# Patient Record
Sex: Male | Born: 1980 | Race: White | Hispanic: No | Marital: Single | Smoking: Never smoker
Health system: Southern US, Community
[De-identification: ages and names within clinical notes are randomized; demographics above are authoritative.]

## PROBLEM LIST (undated history)

## (undated) DIAGNOSIS — I10 Essential (primary) hypertension: Secondary | ICD-10-CM

## (undated) DIAGNOSIS — J45909 Unspecified asthma, uncomplicated: Secondary | ICD-10-CM

## (undated) DIAGNOSIS — T7840XA Allergy, unspecified, initial encounter: Secondary | ICD-10-CM

## (undated) HISTORY — DX: Allergy, unspecified, initial encounter: T78.40XA

## (undated) HISTORY — DX: Unspecified asthma, uncomplicated: J45.909

## (undated) HISTORY — DX: Essential (primary) hypertension: I10

---
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
## 2004-04-23 ENCOUNTER — Emergency Department (HOSPITAL_COMMUNITY): Admission: EM | Admit: 2004-04-23 | Discharge: 2004-04-23 | Payer: Self-pay | Admitting: Family Medicine

## 2005-03-29 ENCOUNTER — Emergency Department (HOSPITAL_COMMUNITY): Admission: EM | Admit: 2005-03-29 | Discharge: 2005-03-29 | Payer: Self-pay | Admitting: Family Medicine

## 2005-04-14 ENCOUNTER — Emergency Department (HOSPITAL_COMMUNITY): Admission: EM | Admit: 2005-04-14 | Discharge: 2005-04-14 | Payer: Self-pay | Admitting: Family Medicine

## 2005-04-19 ENCOUNTER — Emergency Department (HOSPITAL_COMMUNITY): Admission: EM | Admit: 2005-04-19 | Discharge: 2005-04-19 | Payer: Self-pay | Admitting: Family Medicine

## 2006-04-17 ENCOUNTER — Emergency Department (HOSPITAL_COMMUNITY): Admission: EM | Admit: 2006-04-17 | Discharge: 2006-04-17 | Payer: Self-pay | Admitting: Family Medicine

## 2007-04-19 ENCOUNTER — Ambulatory Visit: Payer: Self-pay | Admitting: Internal Medicine

## 2007-04-19 ENCOUNTER — Inpatient Hospital Stay (HOSPITAL_COMMUNITY): Admission: EM | Admit: 2007-04-19 | Discharge: 2007-04-20 | Payer: Self-pay | Admitting: Emergency Medicine

## 2007-04-19 DIAGNOSIS — J982 Interstitial emphysema: Secondary | ICD-10-CM | POA: Insufficient documentation

## 2007-04-20 ENCOUNTER — Ambulatory Visit: Payer: Self-pay | Admitting: Cardiothoracic Surgery

## 2007-04-24 DIAGNOSIS — J42 Unspecified chronic bronchitis: Secondary | ICD-10-CM

## 2007-04-29 ENCOUNTER — Encounter (INDEPENDENT_AMBULATORY_CARE_PROVIDER_SITE_OTHER): Payer: Self-pay | Admitting: *Deleted

## 2007-04-29 ENCOUNTER — Ambulatory Visit: Payer: Self-pay | Admitting: Internal Medicine

## 2007-04-29 DIAGNOSIS — J301 Allergic rhinitis due to pollen: Secondary | ICD-10-CM | POA: Insufficient documentation

## 2007-04-29 LAB — CONVERTED CEMR LAB
Calcium: 9.5 mg/dL (ref 8.4–10.5)
Creatinine, Ser: 1.07 mg/dL (ref 0.40–1.50)
Glucose, Bld: 98 mg/dL (ref 70–99)
Potassium: 3.8 meq/L (ref 3.5–5.3)
Sodium: 141 meq/L (ref 135–145)

## 2008-04-16 ENCOUNTER — Emergency Department (HOSPITAL_COMMUNITY): Admission: EM | Admit: 2008-04-16 | Discharge: 2008-04-16 | Payer: Self-pay | Admitting: Emergency Medicine

## 2008-06-09 ENCOUNTER — Emergency Department (HOSPITAL_COMMUNITY): Admission: EM | Admit: 2008-06-09 | Discharge: 2008-06-09 | Payer: Self-pay | Admitting: Family Medicine

## 2009-04-08 ENCOUNTER — Emergency Department (HOSPITAL_COMMUNITY): Admission: EM | Admit: 2009-04-08 | Discharge: 2009-04-08 | Payer: Self-pay | Admitting: Emergency Medicine

## 2010-01-14 ENCOUNTER — Emergency Department (HOSPITAL_COMMUNITY): Admission: EM | Admit: 2010-01-14 | Discharge: 2010-01-14 | Payer: Self-pay | Admitting: Emergency Medicine

## 2011-03-27 ENCOUNTER — Inpatient Hospital Stay (INDEPENDENT_AMBULATORY_CARE_PROVIDER_SITE_OTHER)
Admission: RE | Admit: 2011-03-27 | Discharge: 2011-03-27 | Disposition: A | Discharge status: Home / Self Care | Payer: Self-pay | Source: Ambulatory Visit | Attending: Family Medicine | Admitting: Family Medicine

## 2011-03-27 DIAGNOSIS — H6120 Impacted cerumen, unspecified ear: Secondary | ICD-10-CM

## 2011-03-27 DIAGNOSIS — J069 Acute upper respiratory infection, unspecified: Secondary | ICD-10-CM

## 2011-03-27 DIAGNOSIS — J309 Allergic rhinitis, unspecified: Secondary | ICD-10-CM

## 2011-04-11 NOTE — Discharge Summary (Signed)
NAMEDUEY, LILLER NO.:  [id number]   MEDICAL RECORD NO.:  [id number]          PATIENT TYPE:  INP   LOCATION:  2925                         FACILITY:  [facility]   PHYSICIAN:  Duncan Dull, M.D.     DATE OF BIRTH:  1981-03-22   DATE OF ADMISSION:  04/19/2007  DATE OF DISCHARGE:  04/20/2007                               DISCHARGE SUMMARY   CONSULTANTS:  Kerin Perna, M.D.   DISCHARGE DIAGNOSIS:  1. Pneumomediastinum, likely secondary to cough effort.  2. History of allergies plus minor bronchitis episodes.   LIST OF MEDICATIONS AT DISCHARGE:  1. Avelox 40 mg p.o. daily for 3 more days.  2. Albuterol MDI inhaler 1 to 2 puffs inhaled every 4 hours p.r.n.      shortness of breath.  3. Afrin nasal spray 0.05% apply in each nostril x2 b.i.d.; do not use      for more than 3 days.  4. Tussionex 5 mL syrup p.o. b.i.d.  5. Mucinex 600 mg p.o. b.i.d.   The patient will have an appointment in the outpatient clinic, and the  clinic will call him with the appointment date and time within 2 weeks  of discharge (the appointment was scheduled through [place] alert).   CONDITION AT DISCHARGE:  Improved.   PROCEDURES:  The patient had a chest x-ray on arrival that showed  minimal pneumomediastinum along the left heart border.  Lung fields  clear, no pneumothorax.  He also had a CT angio of his chest that was negative for acute PE.  It  showed pneumomediastinum, nonspecific, and radiology commented that this  can be seen in asthma, barotrauma, esophageal perforation, and trauma.  Also, patchy ground-glass opacities in the right middle lobe and  lingula, nonspecific.  The patient also had Gastrografin esophagogram that was negative for  leak or strictures.  The study was then performed with thin barium,  revealing normal mucosa andmotility; no stricture or leak, no hiatal  hernia.   HISTORY OF PRESENT ILLNESS:  Mr. Splawn is a 30 year old Caucasian man with  no significant  past medical history except for allergies and bronchitis  episodes that started about 3 years ago, who is a passive smoker and  presents with shortness of breath and mid chest pain that started the  night prior to admission at work.  He gives a history of cough, which is  dry and started about 1-1/2 weeks ago associated with congestion,  sneezing, and no facial pain.  He did not have any sick contacts and did  not recently travel.  The night prior to admission, around 8:30, he  started to wheeze and cough, and after a cough spell, he developed point  chest pain that was associated with left shoulder pain and not changed  with position, and he also developed shortness of breath.  He got worse  overnight, and his mother brought him to the ED around 4 a.m.  He denied  nausea, vomiting, constipation but had diarrhea that started the day  prior to admission.   He has no known drug  allergies.   On physical exam:  VITAL SIGNS:  T-maximum 98.1, blood pressure 160/91, pulse 130,  respiration rate 29, oxygen saturation 96% on 2 liters.  GENERAL EXAM:  He was in mild respiratory distress.  PERRLA.  Extraocular movements intact.  Oropharynx clear.  NECK:  Supple.  No crepitus.  RESPIRATORY:  Diffuse expiratory wheezing, good air movement, prolonged  expiration and rhonchi bilaterally; more in the left middle lobe and  left lower lobe.  CARDIOVASCULAR:  Regular rhythm but tachycardia.  GI:  Soft, nontender, nondistended.  SKIN:  No rashes, no eczema.  NEURO EXAM:  Alert and oriented x3.  Cranial nerves II-XII intact.  MUSCLE STRENGTH:  5/5 in all extremities.  PSYCH:  Appropriate.   LABORATORY:  Sodium 138, potassium 4.0, chloride 105, bicarb 27.8, BUN  12, creatinine 1.3, glucose 105.  White count 14.7, ANC 11.8, hemoglobin  16.9, thrombocytes 300, D-dimer 0.25.  Cardiac markers were normal x3.  Bilirubin total 1.1, alkaline phosphatase 43, AST 29, ALT 66, total  protein 7.4, albumin 4.3, and  calcium 9.3.  Urine drug screen was  negative.  ESR was normal at 12.  Blood cultures were negative x2  preliminarily.  PT, PTT, and INR were normal.   ASSESSMENT AND PLAN:  1. Pneumomediastinum:  The direct cause for this is likely the cough      fit the patient had in the context of bronchitis, as shown on chest      x-ray Pt has a 3-year history of repeated bronchitis episodes and      passive smoking.  Asthma was also possible but pt has no previous      history of asthma, and the patient was not significantly wheezing.      We considered alpha-1 antitrypsin deficiency, especially since one      of his liver enzymes were elevated and also trauma or esophageal      rupture.  Esophageal rupture was ruled out by a normal barium      esophagram, and the patient had no trauma reported in his history.      We gave him Tussionex for cough, albuterol, and Atrovent for      wheezing, Avelox for empiric coverage since he had a white count.      We checked sputum cultures that are still pending.  We consulted      CVTS with Dr. Donata Clay, but since there was no pneumothorax, the      patient did not need surgery right away.  We watched him overnight,      and he was continuously improving.  The patient will probably need      pulmonary function test with metacholine testing in outpatient.  We      wanted to check alpha-1 antitrypsin level, but we could not order      it in our lab.  Might need to have this in outpatient if offered by      the lab.  2. Repeated bronchitis episode:  He is a passive smoker, so chronic      obstructive pulmonary disease is possible.  The patient might need      to have an Ig deficiency workup in outpatient if he continues to      have bronchitis for several months of the year.  3. Passive smoking:  I discussed with the patient about him staying      away from people who smoke, and he understands.  4. Prophylaxis:  The  patient was here on PAS hoses and  Protonix. 5. Mild transaminitis:  The patient had an increased ALT.  We have      done hepatitis panel and hepatitis B surface antigen, hepatitis B      core antibody (IgM), hepatitis C antibody (IgM), and hepatitis C      antibody were all negative.   LABORATORY AT DISCHARGE:  Sodium 137, potassium 3.4 (was corrected),  chloride 100, CO2 of 30, glucose 100, BUN 12, creatinine 1.2, calcium  9.1.  white blood count 8.4, hemoglobin 15.5, hematocrit 45, MCV 89.6,  and platelet count 243.   VITALS:  Temperature 97.6, pulse 60, respirations 13, blood pressure  129/70.  The patient was saturating 94-100% on room air.      Carlus Pavlov, M.D.  Electronically Signed      Duncan Dull, M.D.  Electronically Signed    CG/MEDQ  D:  04/23/2007  T:  04/23/2007  Job:  [id number]   cc:   Kerin Perna, M.D.

## 2011-04-11 NOTE — Consult Note (Signed)
Jared Whitehead, CROCHET NO.:  [id number]   MEDICAL RECORD NO.:  [id number]          PATIENT TYPE:  EMS   LOCATION:  MAJO                         FACILITY:  [facility]   PHYSICIAN:  Kerin Perna, M.D.  DATE OF BIRTH:  1981/09/24   DATE OF CONSULTATION:  04/19/2007  DATE OF DISCHARGE:                                 CONSULTATION   REASON FOR CONSULTATION:  Pneumomediastinum.   CHIEF COMPLAINT:  Shortness of breath and chest discomfort.   REQUESTING PHYSICIAN:  Teaching service.   HISTORY OF PRESENT ILLNESS:  I was asked to evaluate this 30 year old  male who presented to the emergency department early this morning with  shortness of breath and some midchest discomfort.  He has had associated  headache, myalgias, rhinorrhea, and some wheezing recently.  He denies  productive cough, hemoptysis or fever.  The patient was evaluated with a  chest x-ray which showed mild signs of bronchitis.  His pO2 was 66 and  he underwent a CT angiogram.  This showed no evidence of pulmonary  embolus but there was a small amount of pneumomediastinum in the  pretracheal and paratracheal plain.  A Gastrografin swallow was  subsequently performed which showed no esophageal tear or leak.  The  patient is being admitted by the Teaching Service and a thoracic  surgical evaluation was requested.  He is currently symptomatically  improved after treatment with Xopenex nebulized therapy.   PAST MEDICAL HISTORY:  1. No prior hospitalizations.  2. No prior surgery.  3. No allergies.   REVIEW OF SYSTEMS:  CONSTITUTIONAL REVIEW:  Is negative for fever,  weight loss. ENT REVIEW:  Is negative for dental problems or difficulty  swallowing.  THORACIC REVIEW:  Is negative for prior history of abnormal  chest x-ray or any thoracic trauma.  He has not been weight lifting.  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  CARDIAC REVIEW:  Is negative  for history of a murmur, arrhythmia or angina.  RESPIRATORY REVIEW:  Is  positive for his current symptoms of cough, wheezing, dyspnea and chest  discomfort.  GI REVIEW:  Is negative for hepatitis, jaundice or blood  per rectum.  NEUROLOGIC REVIEW:  Is negative for stroke or seizure.  HEMATOLOGIC REVIEW:  Is negative for bleeding disorder.  ENDOCRINE  REVIEW:  Is negative for diabetes.   SOCIAL HISTORY:  The patient is nonsmoker.  He is single.  He does admit  to some episodes of binge drinking.Marland Kitchen   FAMILY HISTORY:  Noncontributory.   PHYSICAL EXAM:  The patient is 6 feet tall and weighs 250 pounds.  Blood  pressure is 160/90, pulse 120, temperature 98.1, saturation 96% on 2  liters.  GENERAL APPEARANCE:  A young, obese, white male in the emergency, in no  distress, accompanied by his mother.  HEENT EXAM:  Is normocephalic.  NECK:  Is without crepitus.  CHEST EXAM:  Reveals no deformity but with scattered wheezes.  CARDIAC EXAM:  Is regular rhythm without S3 gallop or murmur.  ABDOMINAL EXAM:  Is soft, nontender.  EXTREMITIES:  Reveal no cyanosis or clubbing or edema.  Peripheral  pulses are intact.  NEUROLOGIC EXAM:  Is nonfocal.   LABORATORY DATA:  Reviewed the CT scan and chest x-ray and he has mild  pneumomediastinum in the pretracheal and paratracheal plane.  There is  no associated pneumothorax or pulmonary mass or infiltrate or pleural  effusion.   RECOMMENDATIONS:  I agree with admitting the patient, starting him on  bronchodilator therapy.  He probably should be on a long-term asthma  therapeutic program.  I will plan on seeing him in the hospital with a  follow-up chest x-ray in 24 hours.  The situation was discussed with the  patient and mother and all questions addressed.  Thank you for the  consultation.      Kerin Perna, M.D.  Electronically Signed     PV/MEDQ  D:  04/19/2007  T:  04/19/2007  Job:  [id number]

## 2011-04-19 ENCOUNTER — Inpatient Hospital Stay (INDEPENDENT_AMBULATORY_CARE_PROVIDER_SITE_OTHER)
Admission: RE | Admit: 2011-04-19 | Discharge: 2011-04-19 | Disposition: A | Discharge status: Home / Self Care | Payer: 59 | Source: Ambulatory Visit | Attending: Emergency Medicine | Admitting: Emergency Medicine

## 2011-04-19 DIAGNOSIS — J069 Acute upper respiratory infection, unspecified: Secondary | ICD-10-CM

## 2011-04-19 DIAGNOSIS — J45909 Unspecified asthma, uncomplicated: Secondary | ICD-10-CM

## 2012-11-26 ENCOUNTER — Encounter (HOSPITAL_COMMUNITY): Payer: Self-pay | Admitting: *Deleted

## 2012-11-26 ENCOUNTER — Emergency Department (HOSPITAL_COMMUNITY)
Admission: EM | Admit: 2012-11-26 | Discharge: 2012-11-26 | Disposition: A | Discharge status: Home / Self Care | Payer: 59 | Source: Home / Self Care

## 2012-11-26 DIAGNOSIS — I10 Essential (primary) hypertension: Secondary | ICD-10-CM

## 2012-11-26 DIAGNOSIS — J029 Acute pharyngitis, unspecified: Secondary | ICD-10-CM

## 2012-11-26 MED ORDER — ACETAMINOPHEN-CODEINE 300-30 MG PO TABS: 2.0000 | ORAL_TABLET | ORAL | Status: DC | PRN

## 2012-11-26 NOTE — ED Provider Notes (Signed)
History     CSN: [id number]  Arrival date & time 11/26/12  1348   None     Chief Complaint  Patient presents with  . Sore Throat    (Consider location/radiation/quality/duration/timing/severity/associated sxs/prior treatment) HPI Comments: 31 year old morbidly obese male complaining of a sore throat. States there is pain with swallowing. He denies feveror shortness of breath. Occasionally has mild transient headache and rare cough. Denies vomiting or GI symptoms.   History reviewed. No pertinent past medical history.  History reviewed. No pertinent past surgical history.  Family History  Problem Relation Age of Onset  . Family history unknown: Yes    History  Substance Use Topics  . Smoking status: Never Smoker   . Smokeless tobacco: Not on file  . Alcohol Use: No      Review of Systems  Constitutional: Negative for fever, diaphoresis, activity change and fatigue.  HENT: Positive for sore throat and trouble swallowing. Negative for ear pain, facial swelling, rhinorrhea, neck pain, neck stiffness and postnasal drip.   Eyes: Negative for pain, discharge and redness.  Respiratory: Positive for cough. Negative for chest tightness and shortness of breath.   Cardiovascular: Negative.   Gastrointestinal: Negative.   Musculoskeletal: Negative.   Neurological: Negative.     Allergies  Review of patient's allergies indicates no known allergies.  Home Medications   Current Outpatient Rx  Name  Route  Sig  Dispense  Refill  . ACETAMINOPHEN-CODEINE 300-30 MG PO TABS   Oral   Take 2 tablets by mouth every 4 (four) hours as needed for pain.   20 tablet   0     BP 175/104  Pulse 108  Temp 98.3 F (36.8 C) (Oral)  Resp 18  SpO2 97%  Physical Exam  Nursing note and vitals reviewed. Constitutional: He is oriented to person, place, and time. He appears well-developed and well-nourished. No distress.  HENT:       Bilateral TMs are normal Unable to visualize the  oropharynx. The patient is unable or unwilling to relax the tongue or position the tongue in such a way as to visualize the oropharynx. He resists examination by forcing his tongue up to allow tongue deppression in visualization.. No stridor or apparent airway obstruction.   Neck: Normal range of motion. Neck supple.  Cardiovascular: Normal rate and normal heart sounds.   Pulmonary/Chest: Effort normal and breath sounds normal. No respiratory distress. He has no wheezes.  Musculoskeletal: Normal range of motion. He exhibits no edema.  Lymphadenopathy:    He has no cervical adenopathy.  Neurological: He is alert and oriented to person, place, and time. He exhibits normal muscle tone.  Skin: Skin is warm and dry.    ED Course  Procedures (including critical care time)   Labs Reviewed  POCT RAPID STREP A ([facility] ONLY)   No results found.   1. Pharyngitis       MDM   Results for orders placed during the hospital encounter of 11/26/12  POCT RAPID STREP A ([facility] ONLY)      Component Value Range   Streptococcus, Group A Screen (Direct) NEGATIVE  NEGATIVE   There is no fever or palpable cervical lymphadenopathy. The rapid strep test is negative. Suspect the pharyngitis is viral in due to PND. Cepacol lozenges for sore throat relief. Ibuprofen 600 mg every 6 hours when necessary sore throat pain Tylenol 32 tablets every 4 hours as needed for sore throat pain For any worsening new symptoms  problems increasing fever, malaise or worsening may return. He can followup with your physician for elevated blood pressure.        Hayden Rasmussen, NP 11/26/12 2002

## 2012-11-26 NOTE — ED Notes (Signed)
Pt reports sore throat " it feels like knives are in my throat and my neck is sore to touch" slightly productive cough and nasal drainage

## 2012-12-03 NOTE — ED Provider Notes (Signed)
Medical screening examination/treatment/procedure(s) were performed by resident physician or non-physician practitioner and as supervising physician I was immediately available for consultation/collaboration.   Barkley Bruns MD.    Linna Hoff, MD 12/03/12 [phone]

## 2013-09-02 ENCOUNTER — Encounter (HOSPITAL_COMMUNITY): Payer: Self-pay | Admitting: Emergency Medicine

## 2013-09-02 ENCOUNTER — Emergency Department (INDEPENDENT_AMBULATORY_CARE_PROVIDER_SITE_OTHER): Payer: 59

## 2013-09-02 ENCOUNTER — Emergency Department (HOSPITAL_COMMUNITY)
Admission: EM | Admit: 2013-09-02 | Discharge: 2013-09-02 | Disposition: A | Discharge status: Home / Self Care | Payer: 59 | Source: Home / Self Care | Attending: Family Medicine | Admitting: Family Medicine

## 2013-09-02 DIAGNOSIS — M67919 Unspecified disorder of synovium and tendon, unspecified shoulder: Secondary | ICD-10-CM

## 2013-09-02 MED ORDER — HYDROCODONE-ACETAMINOPHEN 5-325 MG PO TABS: 2.0000 | ORAL_TABLET | ORAL | Status: DC | PRN

## 2013-09-02 MED ORDER — PREDNISONE 10 MG PO TABS: ORAL_TABLET | ORAL | Status: DC

## 2013-09-02 MED ORDER — IBUPROFEN 800 MG PO TABS: 800.0000 mg | ORAL_TABLET | Freq: Three times a day (TID) | ORAL | Status: DC

## 2013-09-02 NOTE — ED Notes (Signed)
C/o left shoulder pain which started a week ago.  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   Patient uses OTC tylenol and heat pad as treatment.

## 2013-09-02 NOTE — ED Provider Notes (Signed)
CSN: [id number]     Arrival date & time 09/02/13  7829 History   First MD Initiated Contact with Patient 09/02/13 [phone]     Chief Complaint  Patient presents with  . Shoulder Pain   (Consider location/radiation/quality/duration/timing/severity/associated sxs/prior Treatment) Patient is a 32 y.o. male presenting with shoulder pain. The history is provided by the patient. No language interpreter was used.  Shoulder Pain This is a new problem. The problem occurs constantly. The problem has been gradually worsening. Pertinent negatives include no chest pain and no headaches. Nothing aggravates the symptoms. Nothing relieves the symptoms. He has tried nothing for the symptoms. The treatment provided no relief.   Pt complains of left shoulder pain. Pt reports he lifts repetitively at work   History reviewed. No pertinent past medical history. History reviewed. No pertinent past surgical history. History reviewed. No pertinent family history. History  Substance Use Topics  . Smoking status: Never Smoker   . Smokeless tobacco: Not on file  . Alcohol Use: No    Review of Systems  Cardiovascular: Negative for chest pain.  Musculoskeletal: Positive for joint swelling.  Neurological: Negative for headaches.  All other systems reviewed and are negative.    Allergies  Review of patient's allergies indicates no known allergies.  Home Medications   Current Outpatient Rx  Name  Route  Sig  Dispense  Refill  . Acetaminophen-Codeine (TYLENOL/CODEINE #3) 300-30 MG per tablet   Oral   Take 2 tablets by mouth every 4 (four) hours as needed for pain.   20 tablet   0    BP 161/103  Pulse 95  Temp(Src) 98.2 F (36.8 C) (Oral)  Resp 16  SpO2 99% Physical Exam  Nursing note and vitals reviewed. Constitutional: He appears well-developed and well-nourished.  HENT:  Head: Normocephalic and atraumatic.  Musculoskeletal: He exhibits tenderness.  Tender left shoulder, decreased range of  motion, nv and ns intact  Neurological: He is alert. He has normal reflexes.  Skin: Skin is warm.  Psychiatric: He has a normal mood and affect.    ED Course  Procedures (including critical care time) Labs Review Labs Reviewed - No data to display Imaging Review Dg Shoulder Left  09/02/2013   CLINICAL DATA:  Left shoulder pain.  EXAM: LEFT SHOULDER - 2+ VIEW  COMPARISON:  None.  FINDINGS: There is no evidence of fracture or dislocation. There is no evidence of arthropathy or other focal bone abnormality. Soft tissues are unremarkable.  IMPRESSION: Negative.   Electronically Signed   By: Roque Lias M.D.   On: 09/02/2013 09:56    MDM   1. Tendonitis of shoulder, left    Prednisone taper and hydrocodone.  Pt referred to Dr. Rennis Chris.  For evaluation.    Elson Areas, PA-C 09/02/13 1038

## 2013-09-03 NOTE — ED Provider Notes (Signed)
Medical screening examination/treatment/procedure(s) were performed by resident physician or non-physician practitioner and as supervising physician I was immediately available for consultation/collaboration.   Barkley Bruns MD.   Linna Hoff, MD 09/03/13 2109

## 2014-01-23 IMAGING — CR DG SHOULDER 2+V*L*
3 series · 3 of 3 positions shown · non-contrast
Comparison: None.

CLINICAL DATA: Left shoulder pain.

EXAM:
LEFT SHOULDER - 2+ VIEW

[view not recorded (1 of 3)]
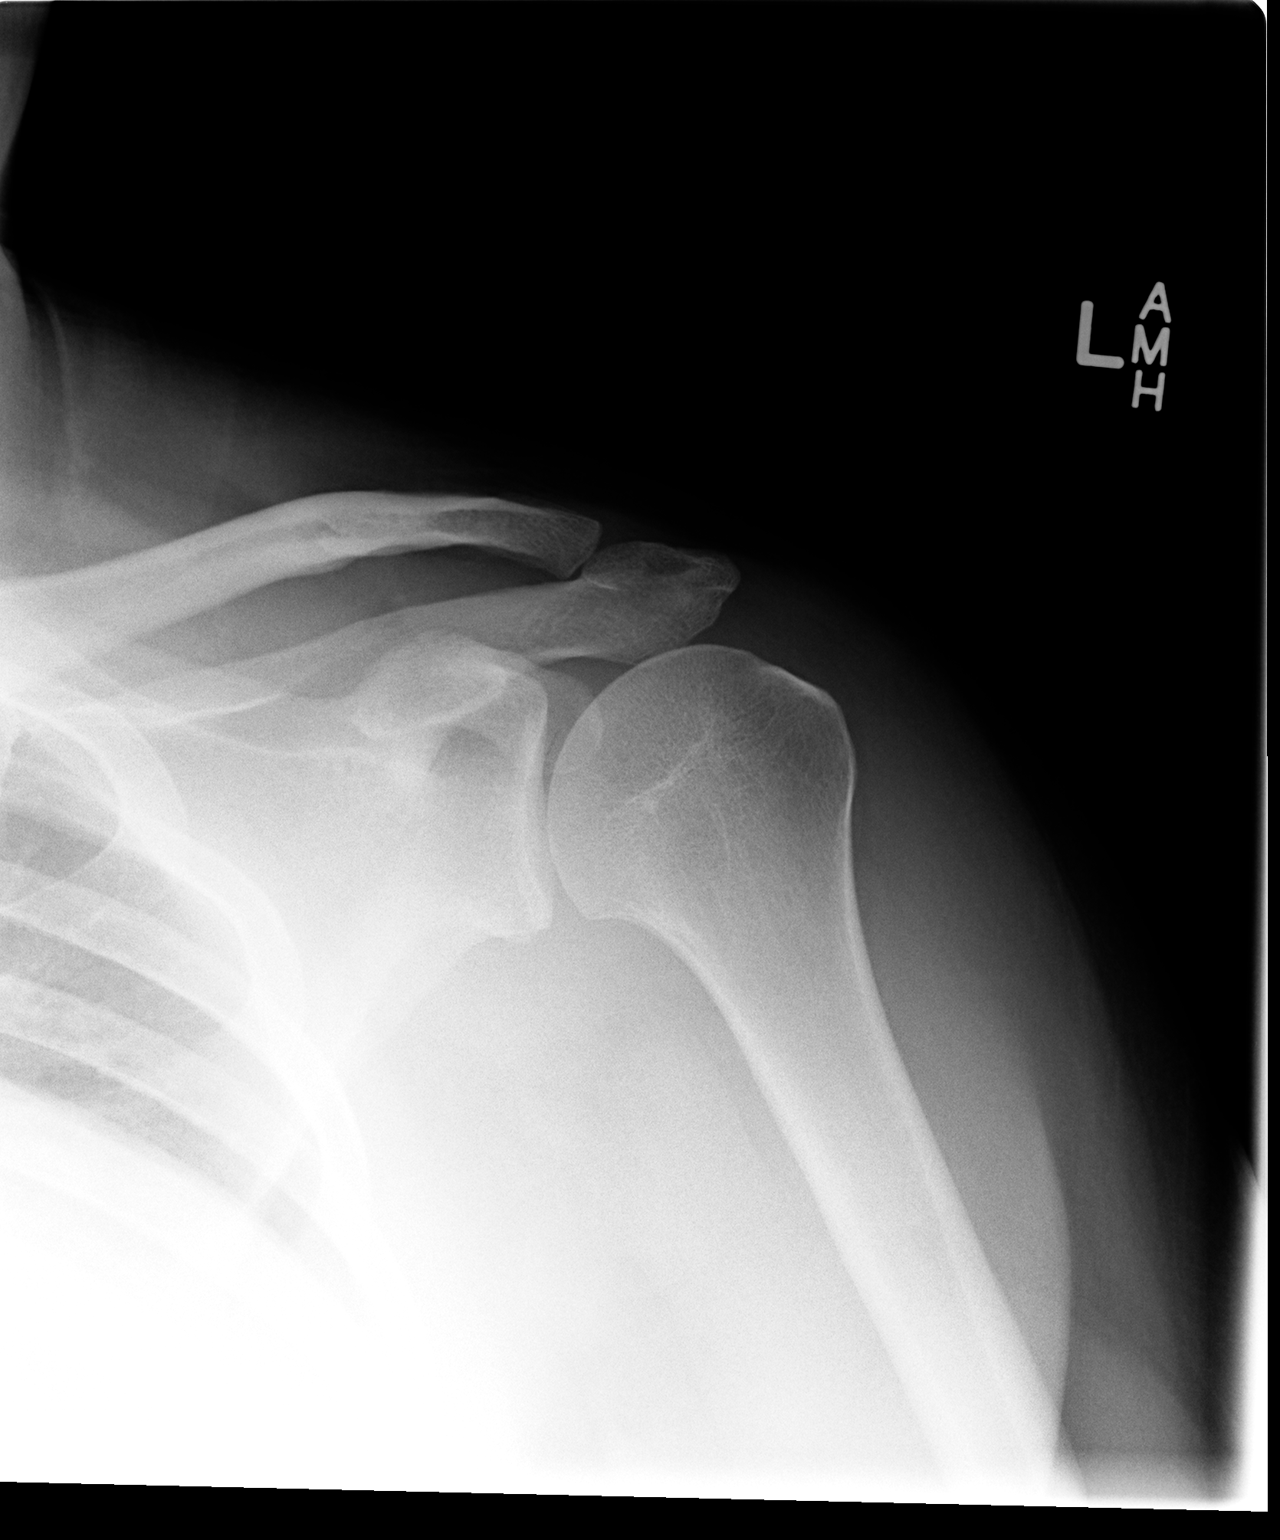

[view not recorded (2 of 3)]
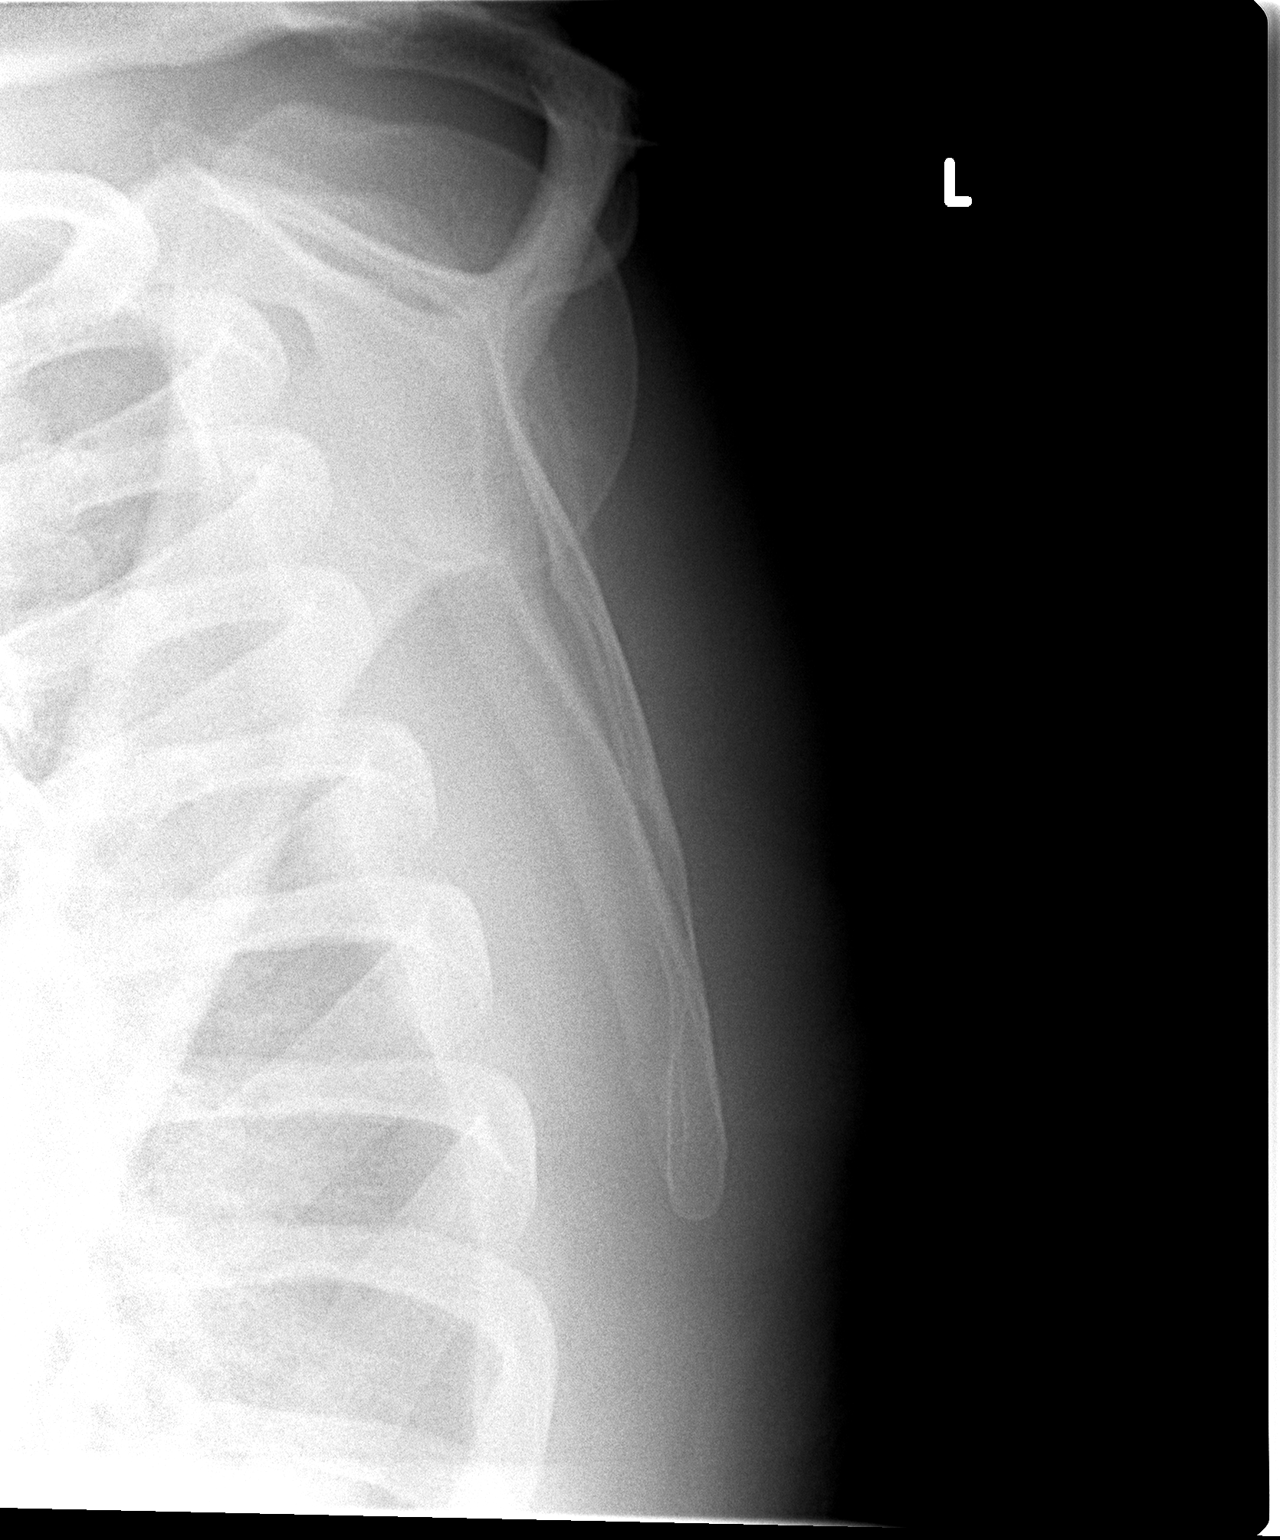

[view not recorded (3 of 3)]
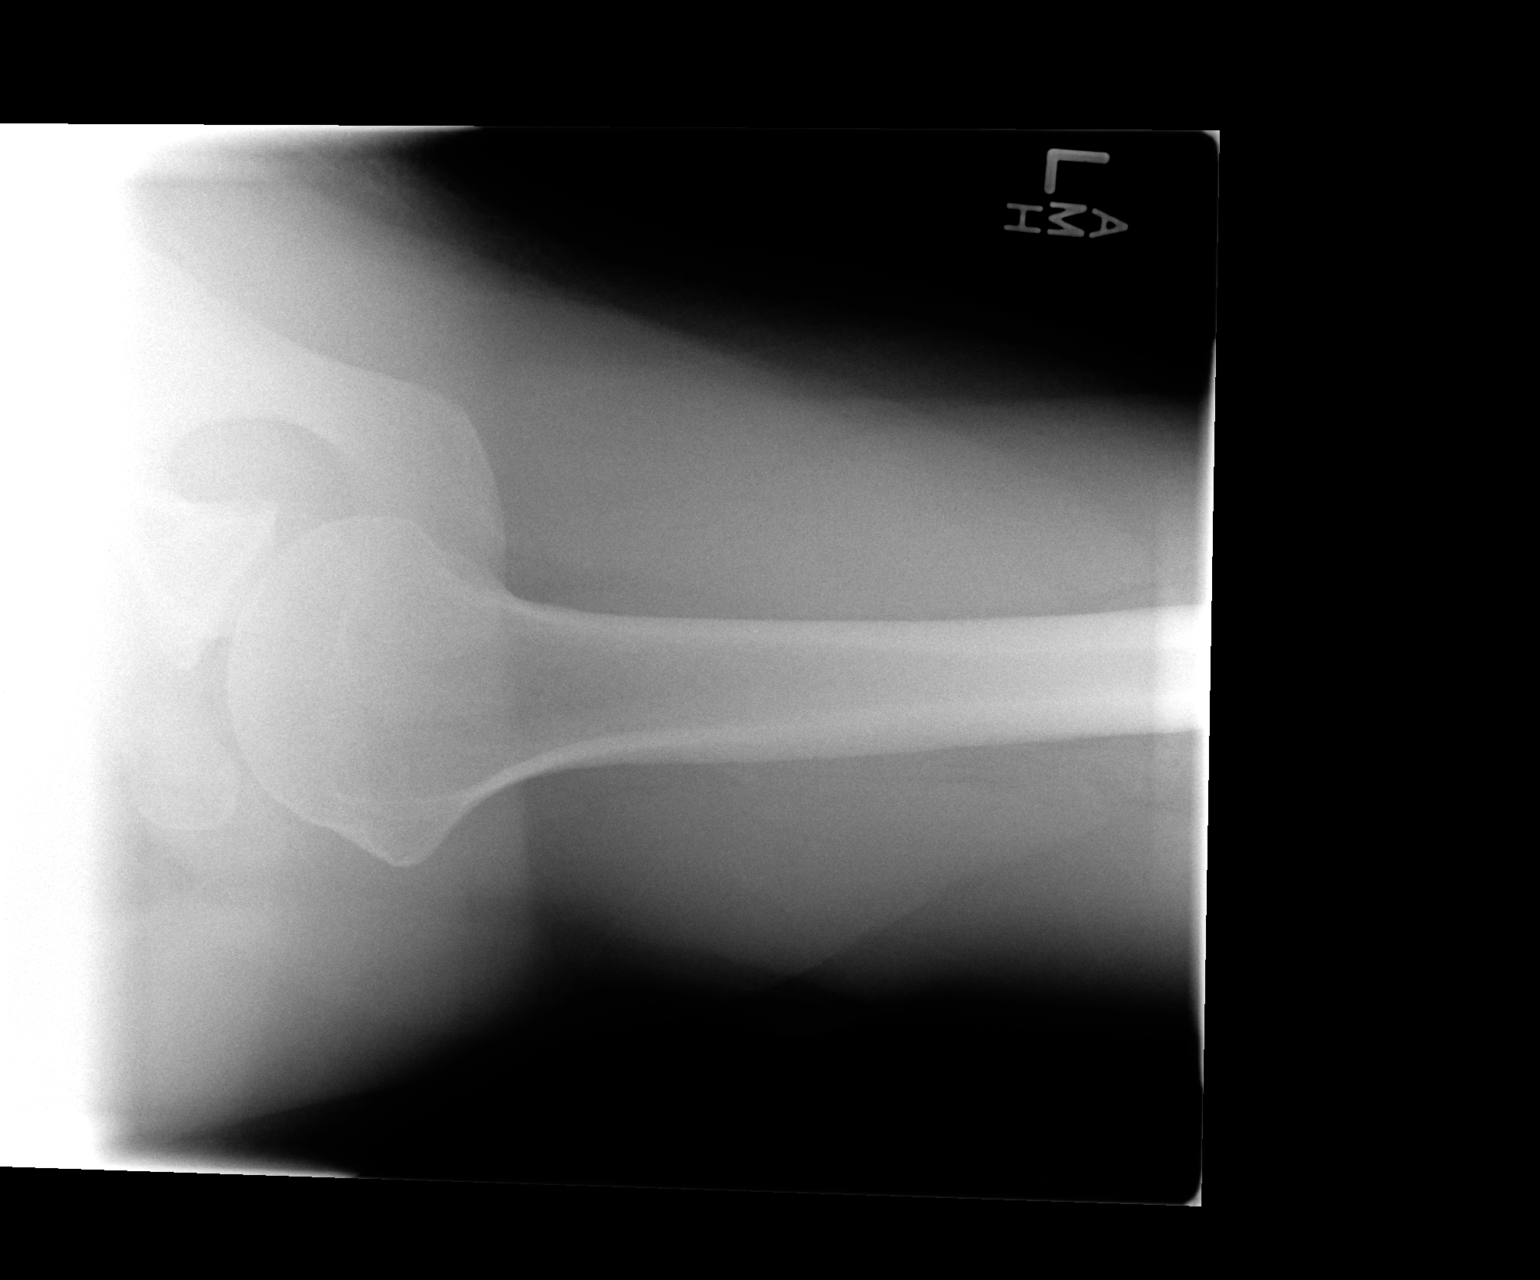

[3 of 3 positions shown; findings below may reference images not displayed]

FINDINGS: There is no evidence of fracture or dislocation. There is no
evidence of arthropathy or other focal bone abnormality. Soft
tissues are unremarkable.
IMPRESSION: Negative.

## 2017-08-09 ENCOUNTER — Other Ambulatory Visit: Payer: Self-pay | Admitting: Physician Assistant

## 2017-08-09 DIAGNOSIS — M25511 Pain in right shoulder: Secondary | ICD-10-CM

## 2018-12-16 ENCOUNTER — Encounter: Payer: Self-pay | Admitting: General Practice

## 2018-12-19 ENCOUNTER — Ambulatory Visit: Payer: BLUE CROSS/BLUE SHIELD | Admitting: Family Medicine

## 2018-12-19 ENCOUNTER — Encounter: Payer: Self-pay | Admitting: Family Medicine

## 2018-12-19 VITALS — BP 132/92 | HR 84 | Temp 98.4°F | Ht 73.0 in | Wt 276.8 lb

## 2018-12-19 DIAGNOSIS — N529 Male erectile dysfunction, unspecified: Secondary | ICD-10-CM | POA: Insufficient documentation

## 2018-12-19 DIAGNOSIS — J309 Allergic rhinitis, unspecified: Secondary | ICD-10-CM | POA: Insufficient documentation

## 2018-12-19 DIAGNOSIS — L84 Corns and callosities: Secondary | ICD-10-CM | POA: Diagnosis not present

## 2018-12-19 DIAGNOSIS — R197 Diarrhea, unspecified: Secondary | ICD-10-CM | POA: Diagnosis not present

## 2018-12-19 DIAGNOSIS — R030 Elevated blood-pressure reading, without diagnosis of hypertension: Secondary | ICD-10-CM | POA: Diagnosis not present

## 2018-12-19 MED ORDER — SILDENAFIL CITRATE 20 MG PO TABS: 20.0000 mg | ORAL_TABLET | Freq: Every day | ORAL | 3 refills | Status: DC | PRN

## 2018-12-19 NOTE — Progress Notes (Signed)
Subjective:  Jared Whitehead is a 38 y.o. male who presents today with a chief complaint of diarrhea and to establish care.   HPI:  Chronic Diarrhea Patient has had issues with chronic diarrhea for most of his life.  This is been happening at least since his teenage years.  Symptoms do not occur all all of the time however will happen a couple of times per week.  There are some food triggers that he has noticed that certain fast food restaurants.  He has tried taking Pepto-Bismol which helps a little bit.  He is also tried some over-the-counter antidiarrhea medications with modest improvement.  Symptoms have been stable over the last several years.  No fevers or chills.  No weight loss.  No nausea or vomiting.  No abdominal pain.  No hematochezia.  No melena.  No known sick contacts.  Elevated blood pressure reading Patient also reports having elevated blood pressure readings for several years into the 130s and sometimes 140s over 80s to 90s.  Does not have any chest pain or shortness of breath.  He has never been treated for hypertension in the past.  He does not check his blood pressure at home.  Erectile dysfunction Symptoms of been present for at least 10 years or so.  He has difficulty obtaining and maintaining erections.  Symptoms have been stable over the last several years.  No dysuria.  No penile discharge.  No urinary frequency or hematuria.  He is able to get normal erections in the morning and at other times during the day.  Normal sex drive.  He has never been treated for this in the past.  Foot callus Also present for several years.  Located on medial aspects of great toes bilaterally.  He will occasionally file down the area which improves the pain however this on the last temporarily.  He has never seen a podiatrist for this.  His stable, chronic medical conditions are outlined below:  # Allergic Rhinitis -Takes over-the-counter allergy medications as needed.  Tolerates this  well.  ROS: Per HPI, otherwise a complete review of systems was negative.   PMH:  The following were reviewed and entered/updated in epic: Past Medical History:  Diagnosis Date  . Allergy   . Asthma   . Hypertension    Patient Active Problem List   Diagnosis Date Noted  . Elevated blood pressure reading 12/19/2018  . Diarrhea 12/19/2018  . Erectile dysfunction 12/19/2018  . Allergic rhinitis 12/19/2018   History reviewed. No pertinent surgical history.  Family History  Adopted: Yes  Problem Relation Age of Onset  . Heart attack Father     Medications- reviewed and updated Current Outpatient Medications  Medication Sig Dispense Refill  . cetirizine (ZYRTEC) 10 MG tablet Take 10 mg by mouth daily.    Marland Kitchen Fexofenadine HCl (MUCINEX ALLERGY PO) Take by mouth.    . sildenafil (REVATIO) 20 MG tablet Take 1-5 tablets (20-100 mg total) by mouth daily as needed (erectile dysfunction). 90 tablet 3   No current facility-administered medications for this visit.     Allergies-reviewed and updated No Known Allergies  Social History   Socioeconomic History  . Marital status: Single    Spouse name: Not on file  . Number of children: Not on file  . Years of education: Not on file  . Highest education level: Not on file  Occupational History  . Not on file  Social Needs  . Financial resource strain: Not on file  .  Food insecurity:    Worry: Not on file    Inability: Not on file  . Transportation needs:    Medical: Not on file    Non-medical: Not on file  Tobacco Use  . Smoking status: Never Smoker  . Smokeless tobacco: Never Used  Substance and Sexual Activity  . Alcohol use: Yes  . Drug use: Not on file  . Sexual activity: Never  Lifestyle  . Physical activity:    Days per week: Not on file    Minutes per session: Not on file  . Stress: Not on file  Relationships  . Social connections:    Talks on phone: Not on file    Gets together: Not on file    Attends  religious service: Not on file    Active member of club or organization: Not on file    Attends meetings of clubs or organizations: Not on file    Relationship status: Not on file  Other Topics Concern  . Not on file  Social History Narrative  . Not on file     Objective:  Physical Exam: BP (!) 132/92 (BP Location: Left Arm, Cuff Size: Large)   Pulse 84   Temp 98.4 F (36.9 C) (Oral)   Ht _0  (1.854 m)   Wt 276 lb 12 oz (125.5 kg)   SpO2 97%   BMI 36.51 kg/m   Gen: NAD, resting comfortably CV: RRR with no murmurs appreciated Pulm: NWOB, CTAB with no crackles, wheezes, or rhonchi GI: Normal bowel sounds present. Soft, Nontender, Nondistended. MSK: No edema, cyanosis, or clubbing noted -Feet: Bilateral callus noted at medial aspect of first interphalangeal joint bilaterally. Skin: Warm, dry Neuro: Grossly normal, moves all extremities Psych: Normal affect and thought content  Assessment/Plan:  Erectile dysfunction Start sildenafil 20 to 100 mg daily as needed.  Discussed potential side effects.  Check CBC, CMAC, and TSH.  Elevated blood pressure reading Slightly above goal.  Discussed lifestyle modifications including regular exercise and healthy, low-salt diet.  He is currently walking about 2 miles per day.  We will not start antihypertensives today.  Advised him to continue home blood pressure monitoring with goal 140/90 or lower.  He will follow-up with me in a few months for his CPE, or sooner as needed blood pressures not at goal.  Check CBC, C met, and TSH.  Diarrhea No red flags.  Likely dietary intolerance.  Recommended avoidance of specific triggers.  Recommended diet high in fiber with supplementation as needed.  Also recommended probiotics.  He can continue using Imodium as needed.  Will check CBC, C met, and TSH today.  Allergic rhinitis Stable on over-the-counter allergy medications.  Foot callus Referral placed to podiatry.  Preventative  Healthcare Patient was instructed to return soon for CPE. Health Maintenance Due  Topic Date Due  . HIV Screening  01/08/1996  . TETANUS/TDAP  01/08/2000      [name] M. Jerline Pain, MD 12/19/2018 5:04 PM

## 2018-12-19 NOTE — Assessment & Plan Note (Signed)
Stable on over-the-counter allergy medications.

## 2018-12-19 NOTE — Assessment & Plan Note (Addendum)
Slightly above goal.  Discussed lifestyle modifications including regular exercise and healthy, low-salt diet.  He is currently walking about 2 miles per day.  We will not start antihypertensives today.  Advised him to continue home blood pressure monitoring with goal 140/90 or lower.  He will follow-up with me in a few months for his CPE, or sooner as needed blood pressures not at goal.  Check CBC, C met, and TSH.

## 2018-12-19 NOTE — Assessment & Plan Note (Signed)
Start sildenafil 20 to 100 mg daily as needed.  Discussed potential side effects.  Check CBC, CMAC, and TSH.

## 2018-12-19 NOTE — Patient Instructions (Signed)
It was very nice to see you today!  Please try taking a fiber supplement such as Metamucil or Benefiber for your diarrhea.  You can take Imodium as needed.  Please also try taking a probiotic for this.  Please keep a close eye on your blood pressures and let me know if persistently 140/90 or higher.  Please make sure you are being active and eating a low-salt diet.  Please try the Viagra and let me know if you have any side effects.  I will also refer you to see a podiatrist today.  Please come back to see me in a few months for your annual physical with blood work, or sooner as needed.  Take care, Dr Jimmey Ralph

## 2018-12-19 NOTE — Assessment & Plan Note (Signed)
No red flags.  Likely dietary intolerance.  Recommended avoidance of specific triggers.  Recommended diet high in fiber with supplementation as needed.  Also recommended probiotics.  He can continue using Imodium as needed.  Will check CBC, C met, and TSH today.

## 2018-12-20 ENCOUNTER — Encounter: Payer: Self-pay | Admitting: Family Medicine

## 2018-12-20 DIAGNOSIS — R739 Hyperglycemia, unspecified: Secondary | ICD-10-CM | POA: Insufficient documentation

## 2018-12-20 DIAGNOSIS — E119 Type 2 diabetes mellitus without complications: Secondary | ICD-10-CM | POA: Insufficient documentation

## 2018-12-20 LAB — COMPREHENSIVE METABOLIC PANEL
ALT: 40 U/L (ref 0–53)
AST: 25 U/L (ref 0–37)
Albumin: 4.7 g/dL (ref 3.5–5.2)
Alkaline Phosphatase: 49 U/L (ref 39–117)
BUN: 13 mg/dL (ref 6–23)
CHLORIDE: 100 meq/L (ref 96–112)
CO2: 28 meq/L (ref 19–32)
Calcium: 10 mg/dL (ref 8.4–10.5)
Creatinine, Ser: 0.89 mg/dL (ref 0.40–1.50)
GFR: 95.69 mL/min (ref >60.00)
Glucose, Bld: 122 mg/dL — ABNORMAL HIGH (ref 70–99)
POTASSIUM: 4.2 meq/L (ref 3.5–5.1)
Sodium: 139 mEq/L (ref 135–145)
Total Bilirubin: 0.8 mg/dL (ref 0.2–1.2)
Total Protein: 7.8 g/dL (ref 6.0–8.3)

## 2018-12-20 LAB — TSH: TSH: 1.88 u[IU]/mL (ref 0.35–4.50)

## 2018-12-20 LAB — CBC
HEMATOCRIT: 44.6 % (ref 39.0–52.0)
HEMOGLOBIN: 15.5 g/dL (ref 13.0–17.0)
MCHC: 34.7 g/dL (ref 30.0–36.0)
MCV: 86.9 fl (ref 78.0–100.0)
PLATELETS: 256 10*3/uL (ref 150.0–400.0)
RBC: 5.13 Mil/uL (ref 4.22–5.81)
RDW: 13.6 % (ref 11.5–15.5)
WBC: 7 10*3/uL (ref 4.0–10.5)

## 2018-12-20 NOTE — Progress Notes (Signed)
Please inform patient of the following:  Blood sugar was a bit high, but patient was not fasting - we can recheck later.   All of his other labs were normal. Recommend continuing current current treatment plan as we discussed in his office visit. Would like for him to let me know if not improving or if worsening.  Katina Degree. Jimmey Ralph, MD 12/20/2018 12:24 PM

## 2019-01-22 ENCOUNTER — Encounter: Payer: Self-pay | Admitting: Podiatry

## 2019-01-22 ENCOUNTER — Ambulatory Visit: Payer: BLUE CROSS/BLUE SHIELD | Admitting: Podiatry

## 2019-01-22 VITALS — BP 180/94 | HR 91

## 2019-01-22 DIAGNOSIS — M205X9 Other deformities of toe(s) (acquired), unspecified foot: Secondary | ICD-10-CM

## 2019-01-22 DIAGNOSIS — M2020 Hallux rigidus, unspecified foot: Secondary | ICD-10-CM

## 2019-01-22 DIAGNOSIS — L84 Corns and callosities: Secondary | ICD-10-CM | POA: Diagnosis not present

## 2019-01-22 NOTE — Progress Notes (Signed)
This patient presents to the office with chief complaint of painful callus on the big toes  Both feet.  He says the callus is painful walking and wearing his shoes.  He says the callus have been present for years and he has occasional bleeding left great toe.  He presents for evaluation and treatment.  General Appearance  Alert, conversant and in no acute stress.  Vascular  Dorsalis pedis and posterior tibial  pulses are palpable  bilaterally.  Capillary return is within normal limits  bilaterally. Temperature is within normal limits  bilaterally.  Neurologic  Senn-Weinstein monofilament wire test within normal limits  bilaterally. Muscle power within normal limits bilaterally.  Nails Normal nails noted with no evidence of bacterial or fungal infection.  Orthopedic  No limitations of motion  feet .  No crepitus or effusions noted.  No bony pathology or digital deformities noted.Hallux limitus 1st MPJ  B/L.  Skin  normotropic skin with no porokeratosis noted bilaterally.  No signs of infections or ulcers noted.    Pinch callus secondary to hallux limitus 1st MPJ  B/L.  IE.  Debride callus.  Discussed this condition with this patient.  We discussed conservative treatment vs. Surgical correction.  Patient desires to think about the surgery and will call in the future.   Helane Gunther DPM

## 2019-01-30 ENCOUNTER — Ambulatory Visit: Payer: BLUE CROSS/BLUE SHIELD | Admitting: Podiatry

## 2019-02-07 ENCOUNTER — Ambulatory Visit: Payer: BLUE CROSS/BLUE SHIELD | Admitting: Podiatry

## 2019-02-12 ENCOUNTER — Ambulatory Visit (INDEPENDENT_AMBULATORY_CARE_PROVIDER_SITE_OTHER): Payer: BLUE CROSS/BLUE SHIELD | Admitting: Family Medicine

## 2019-02-12 ENCOUNTER — Other Ambulatory Visit: Payer: Self-pay

## 2019-02-12 ENCOUNTER — Encounter: Payer: Self-pay | Admitting: Family Medicine

## 2019-02-12 VITALS — BP 132/78 | HR 85 | Temp 98.0°F | Ht 73.0 in | Wt 279.4 lb

## 2019-02-12 DIAGNOSIS — R0683 Snoring: Secondary | ICD-10-CM | POA: Diagnosis not present

## 2019-02-12 DIAGNOSIS — J020 Streptococcal pharyngitis: Secondary | ICD-10-CM

## 2019-02-12 MED ORDER — AMOXICILLIN 875 MG PO TABS: 875.0000 mg | ORAL_TABLET | Freq: Two times a day (BID) | ORAL | 0 refills | Status: DC

## 2019-02-12 NOTE — Patient Instructions (Addendum)
It was very nice to see you today!  Please start amoxicillin.  You can try using the over-the-counter products to help you with snoring.  Please let me know if you would like to have a sleep study done.    Let me know if your symptoms do not improve urgentover the next 5-7 days.   Take care, Dr Jimmey Ralph

## 2019-02-12 NOTE — Progress Notes (Signed)
   Chief Complaint:  Jared Whitehead is a 38 y.o. male who presents for same day appointment with a chief complaint of sore throat.   Assessment/Plan:  Sore Throat Throat culture positive for Streptococcus at urgent care.  Will start amoxicillin.  Continue good oral hydration.  Continue over-the-counter analgesics as needed.  Snoring May have underlying OSA.  Recommended sleep study however patient declined.  He will continue using over-the-counter appliances.  Symptoms likely worsened in setting of recent URI with strep pharyngitis.  Discussed reasons to return to care.     Subjective:  HPI:  Sore Throat, acute problem Started about a week ago. Went to urgent care 6 days ago. Had negative rapid strep.  Was given a course of prednisone which is helped with his symptoms.  He has had some left-sided jaw and throat pain over the last few days that seem to be improving.  No other treatments tried.  No known sick contacts. No other obvious alleviating or aggravating factors.   Snoring Several year history.  Worsened recently.  He is tried nasal appliances which seems to help.  He has had sleep apnea symptoms in the past including daytime drowsiness however those have improved over last couple of years.   ROS: Per HPI  PMH: He reports that he has never smoked. He has never used smokeless tobacco. He reports current alcohol use. No history on file for drug.      Objective:  Physical Exam: BP 132/78 (BP Location: Left Arm, Patient Position: Sitting, Cuff Size: Large)   Pulse 85   Temp 98 F (36.7 C) (Oral)   Ht 6\' 1"  (1.854 m)   Wt 279 lb 6.4 oz (126.7 kg)   SpO2 98%   BMI 36.86 kg/m   Gen: NAD, resting comfortably HEENT: Mild bilateral tonsillar hypertrophy noted.  Shotty submandibular and anterior cervical lymphadenopathy. CV: Regular rate and rhythm with no murmurs appreciated Pulm: Normal work of breathing, clear to auscultation bilaterally with no crackles, wheezes, or rhonchi     Caleb M. Jimmey Ralph, MD 02/12/2019 11:10 AM

## 2019-02-12 NOTE — Assessment & Plan Note (Signed)
May have underlying OSA.  Recommended sleep study however patient declined.  He will continue using over-the-counter appliances.  Symptoms likely worsened in setting of recent URI with strep pharyngitis.  Discussed reasons to return to care.

## 2019-02-17 ENCOUNTER — Encounter: Payer: BLUE CROSS/BLUE SHIELD | Admitting: Family Medicine

## 2019-05-27 ENCOUNTER — Encounter: Payer: BLUE CROSS/BLUE SHIELD | Admitting: Family Medicine

## 2019-06-10 ENCOUNTER — Encounter: Payer: BC Managed Care – PPO | Admitting: Family Medicine

## 2019-07-01 ENCOUNTER — Encounter: Payer: BC Managed Care – PPO | Admitting: Family Medicine

## 2019-08-12 ENCOUNTER — Other Ambulatory Visit: Payer: Self-pay

## 2019-08-12 ENCOUNTER — Encounter: Payer: Self-pay | Admitting: Family Medicine

## 2019-08-12 ENCOUNTER — Ambulatory Visit (INDEPENDENT_AMBULATORY_CARE_PROVIDER_SITE_OTHER): Payer: BC Managed Care – PPO | Admitting: Family Medicine

## 2019-08-12 VITALS — BP 144/97 | HR 96 | Temp 97.2°F | Ht 73.0 in | Wt 282.2 lb

## 2019-08-12 DIAGNOSIS — Z6837 Body mass index (BMI) 37.0-37.9, adult: Secondary | ICD-10-CM

## 2019-08-12 DIAGNOSIS — R739 Hyperglycemia, unspecified: Secondary | ICD-10-CM

## 2019-08-12 DIAGNOSIS — R030 Elevated blood-pressure reading, without diagnosis of hypertension: Secondary | ICD-10-CM | POA: Diagnosis not present

## 2019-08-12 DIAGNOSIS — Z0001 Encounter for general adult medical examination with abnormal findings: Secondary | ICD-10-CM | POA: Diagnosis not present

## 2019-08-12 DIAGNOSIS — E669 Obesity, unspecified: Secondary | ICD-10-CM

## 2019-08-12 DIAGNOSIS — Z13220 Encounter for screening for lipoid disorders: Secondary | ICD-10-CM | POA: Diagnosis not present

## 2019-08-12 LAB — COMPREHENSIVE METABOLIC PANEL
ALT: 33 U/L (ref 0–53)
AST: 20 U/L (ref 0–37)
Albumin: 4.3 g/dL (ref 3.5–5.2)
Alkaline Phosphatase: 60 U/L (ref 39–117)
BUN: 11 mg/dL (ref 6–23)
CO2: 29 mEq/L (ref 19–32)
Calcium: 9.4 mg/dL (ref 8.4–10.5)
Chloride: 98 mEq/L (ref 96–112)
Creatinine, Ser: 0.95 mg/dL (ref 0.40–1.50)
GFR: 88.45 mL/min (ref >60.00)
Glucose, Bld: 286 mg/dL — ABNORMAL HIGH (ref 70–99)
Potassium: 4.3 mEq/L (ref 3.5–5.1)
Sodium: 136 mEq/L (ref 135–145)
Total Bilirubin: 0.6 mg/dL (ref 0.2–1.2)
Total Protein: 7.2 g/dL (ref 6.0–8.3)

## 2019-08-12 LAB — CBC
HCT: 45.9 % (ref 39.0–52.0)
Hemoglobin: 15.7 g/dL (ref 13.0–17.0)
MCHC: 34.3 g/dL (ref 30.0–36.0)
MCV: 86.2 fl (ref 78.0–100.0)
Platelets: 225 10*3/uL (ref 150.0–400.0)
RBC: 5.33 Mil/uL (ref 4.22–5.81)
RDW: 13 % (ref 11.5–15.5)
WBC: 6.6 10*3/uL (ref 4.0–10.5)

## 2019-08-12 LAB — LDL CHOLESTEROL, DIRECT: Direct LDL: 83 mg/dL

## 2019-08-12 LAB — TSH: TSH: 3.28 u[IU]/mL (ref 0.35–4.50)

## 2019-08-12 LAB — LIPID PANEL
Cholesterol: 169 mg/dL (ref 0–200)
HDL: 36.5 mg/dL — ABNORMAL LOW (ref >39.00)
NonHDL: 132.17
Total CHOL/HDL Ratio: 5
Triglycerides: 352 mg/dL — ABNORMAL HIGH (ref 0.0–149.0)
VLDL: 70.4 mg/dL — ABNORMAL HIGH (ref 0.0–40.0)

## 2019-08-12 LAB — HEMOGLOBIN A1C: Hgb A1c MFr Bld: 11 % — ABNORMAL HIGH (ref 4.6–6.5)

## 2019-08-12 NOTE — Progress Notes (Signed)
Chief Complaint:  Jared Whitehead is a 38 y.o. male who presents today for his annual comprehensive physical exam.    Assessment/Plan:  Hyperglycemia Check A1c.  Discussed lifestyle modifications.  Elevated blood pressure reading Slightly above goal.  Previously well controlled.  Discussed home blood pressure monitoring with goal 140/90 or below.  Discussed lifestyle modifications including regular exercise and low-sodium diet.   Body mass index is 37.24 kg/m. / Obese BMI Metric Follow Up - 08/12/19 0925      BMI Metric Follow Up-Please document annually   BMI Metric Follow Up  Education provided        Preventative Healthcare: Flu shot declined.  Check CBC, C met, TSH, and lipid panel.  Patient Counseling(The following topics were reviewed and/or handout was given):  -Nutrition: Stressed importance of moderation in sodium/caffeine intake, saturated fat and cholesterol, caloric balance, sufficient intake of fresh fruits, vegetables, and fiber.  -Stressed the importance of regular exercise.   -Substance Abuse: Discussed cessation/primary prevention of tobacco, alcohol, or other drug use; driving or other dangerous activities under the influence; availability of treatment for abuse.   -Injury prevention: Discussed safety belts, safety helmets, smoke detector, smoking near bedding or upholstery.   -Sexuality: Discussed sexually transmitted diseases, partner selection, use of condoms, avoidance of unintended pregnancy and contraceptive alternatives.   -Dental health: Discussed importance of regular tooth brushing, flossing, and dental visits.  -Health maintenance and immunizations reviewed. Please refer to Health maintenance section.  Return to care in 1 year for next preventative visit.     Subjective:  HPI:  He has no acute complaints today.   His stable, chronic medical conditions are outlined below:   # Erectile Dysfunction - Uses sildenafil as needed.   Lifestyle Diet:  No specific diets or eating plans.  Exercise: No specific exercises.   Depression screen PHQ 2/9 02/12/2019  Decreased Interest 0  Down, Depressed, Hopeless 0  PHQ - 2 Score 0    Health Maintenance Due  Topic Date Due  . HIV Screening  01/08/1996     ROS: Per HPI, otherwise a complete review of systems was negative.   PMH:  The following were reviewed and entered/updated in epic: Past Medical History:  Diagnosis Date  . Allergy   . Asthma   . Hypertension    Patient Active Problem List   Diagnosis Date Noted  . Snoring 02/12/2019  . Hyperglycemia 12/20/2018  . Elevated blood pressure reading 12/19/2018  . Erectile dysfunction 12/19/2018  . Allergic rhinitis 12/19/2018   History reviewed. No pertinent surgical history.  Family History  Adopted: Yes  Problem Relation Age of Onset  . Heart attack Father     Medications- reviewed and updated No current outpatient medications on file.   No current facility-administered medications for this visit.     Allergies-reviewed and updated No Known Allergies  Social History   Socioeconomic History  . Marital status: Single    Spouse name: Not on file  . Number of children: Not on file  . Years of education: Not on file  . Highest education level: Not on file  Occupational History  . Not on file  Social Needs  . Financial resource strain: Not on file  . Food insecurity    Worry: Not on file    Inability: Not on file  . Transportation needs    Medical: Not on file    Non-medical: Not on file  Tobacco Use  . Smoking status: Never Smoker  .  Smokeless tobacco: Never Used  Substance and Sexual Activity  . Alcohol use: Yes  . Drug use: Not on file  . Sexual activity: Never  Lifestyle  . Physical activity    Days per week: Not on file    Minutes per session: Not on file  . Stress: Not on file  Relationships  . Social Herbalist on phone: Not on file    Gets together: Not on file    Attends  religious service: Not on file    Active member of club or organization: Not on file    Attends meetings of clubs or organizations: Not on file    Relationship status: Not on file  Other Topics Concern  . Not on file  Social History Narrative  . Not on file        Objective:  Physical Exam: BP (!) 144/97   Pulse 96   Temp (!) 97.2 F (36.2 C)   Ht '6\' 1"'$  (1.854 m)   Wt 282 lb 4 oz (128 kg)   SpO2 96%   BMI 37.24 kg/m   Body mass index is 37.24 kg/m. Wt Readings from Last 3 Encounters:  08/12/19 282 lb 4 oz (128 kg)  02/12/19 279 lb 6.4 oz (126.7 kg)  12/19/18 276 lb 12 oz (125.5 kg)   Gen: NAD, resting comfortably HEENT: TMs normal bilaterally. OP clear. No thyromegaly noted.  CV: RRR with no murmurs appreciated Pulm: NWOB, CTAB with no crackles, wheezes, or rhonchi GI: Normal bowel sounds present. Soft, Nontender, Nondistended. MSK: no edema, cyanosis, or clubbing noted Skin: warm, dry Neuro: CN2-12 grossly intact. Strength 5/5 in upper and lower extremities. Reflexes symmetric and intact bilaterally.  Psych: Normal affect and thought content     Jared Whitehead M. Jared Pain, MD 08/12/2019 9:25 AM

## 2019-08-12 NOTE — Assessment & Plan Note (Signed)
Check A1c.  Discussed lifestyle modifications. °

## 2019-08-12 NOTE — Patient Instructions (Signed)
It was very nice to see you today!  Keep an eye on your blood pressure and let me know if 140/90 or higher persistently.  No medication changes today.  We will check blood work today.  Come back to see me in 1 year for your physical, or sooner if needed.  Take care, Dr Jerline Pain  Please try these tips to maintain a healthy lifestyle:   Eat at least 3 REAL meals and 1-2 snacks per day.  Aim for no more than 5 hours between eating.  If you eat breakfast, please do so within one hour of getting up.    Obtain twice as many fruits/vegetables as protein or carbohydrate foods for both lunch and dinner. (Half of each meal should be fruits/vegetables, one quarter protein, and one quarter starchy carbs)   Cut down on sweet beverages. This includes juice, soda, and sweet tea.    Exercise at least 150 minutes every week.    Preventive Care 28-52 Years Old, Male Preventive care refers to lifestyle choices and visits with your health care provider that can promote health and wellness. This includes:  A yearly physical exam. This is also called an annual well check.  Regular dental and eye exams.  Immunizations.  Screening for certain conditions.  Healthy lifestyle choices, such as eating a healthy diet, getting regular exercise, not using drugs or products that contain nicotine and tobacco, and limiting alcohol use. What can I expect for my preventive care visit? Physical exam Your health care provider will check:  Height and weight. These may be used to calculate body mass index (BMI), which is a measurement that tells if you are at a healthy weight.  Heart rate and blood pressure.  Your skin for abnormal spots. Counseling Your health care provider may ask you questions about:  Alcohol, tobacco, and drug use.  Emotional well-being.  Home and relationship well-being.  Sexual activity.  Eating habits.  Work and work Statistician. What immunizations do I need?  Influenza  (flu) vaccine  This is recommended every year. Tetanus, diphtheria, and pertussis (Tdap) vaccine  You may need a Td booster every 10 years. Varicella (chickenpox) vaccine  You may need this vaccine if you have not already been vaccinated. Human papillomavirus (HPV) vaccine  If recommended by your health care provider, you may need three doses over 6 months. Measles, mumps, and rubella (MMR) vaccine  You may need at least one dose of MMR. You may also need a second dose. Meningococcal conjugate (MenACWY) vaccine  One dose is recommended if you are 61-29 years old and a Market researcher living in a residence hall, or if you have one of several medical conditions. You may also need additional booster doses. Pneumococcal conjugate (PCV13) vaccine  You may need this if you have certain conditions and were not previously vaccinated. Pneumococcal polysaccharide (PPSV23) vaccine  You may need one or two doses if you smoke cigarettes or if you have certain conditions. Hepatitis A vaccine  You may need this if you have certain conditions or if you travel or work in places where you may be exposed to hepatitis A. Hepatitis B vaccine  You may need this if you have certain conditions or if you travel or work in places where you may be exposed to hepatitis B. Haemophilus influenzae type b (Hib) vaccine  You may need this if you have certain risk factors. You may receive vaccines as individual doses or as more than one vaccine together in one  shot (combination vaccines). Talk with your health care provider about the risks and benefits of combination vaccines. What tests do I need? Blood tests  Lipid and cholesterol levels. These may be checked every 5 years starting at age 53.  Hepatitis C test.  Hepatitis B test. Screening   Diabetes screening. This is done by checking your blood sugar (glucose) after you have not eaten for a while (fasting).  Sexually transmitted disease  (STD) testing. Talk with your health care provider about your test results, treatment options, and if necessary, the need for more tests. Follow these instructions at home: Eating and drinking   Eat a diet that includes fresh fruits and vegetables, whole grains, lean protein, and low-fat dairy products.  Take vitamin and mineral supplements as recommended by your health care provider.  Do not drink alcohol if your health care provider tells you not to drink.  If you drink alcohol: ? Limit how much you have to 0-2 drinks a day. ? Be aware of how much alcohol is in your drink. In the [place] one drink equals one 12 oz bottle of beer (355 mL), one 5 oz glass of wine (148 mL), or one 1 oz glass of hard liquor (44 mL). Lifestyle  Take daily care of your teeth and gums.  Stay active. Exercise for at least 30 minutes on 5 or more days each week.  Do not use any products that contain nicotine or tobacco, such as cigarettes, e-cigarettes, and chewing tobacco. If you need help quitting, ask your health care provider.  If you are sexually active, practice safe sex. Use a condom or other form of protection to prevent STIs (sexually transmitted infections). What's next?  Go to your health care provider once a year for a well check visit.  Ask your health care provider how often you should have your eyes and teeth checked.  Stay up to date on all vaccines. This information is not intended to replace advice given to you by your health care provider. Make sure you discuss any questions you have with your health care provider. Document Released: 01/09/2002 Document Revised: 11/07/2018 Document Reviewed: 11/07/2018 Elsevier Patient Education  2020 [employer].

## 2019-08-12 NOTE — Assessment & Plan Note (Signed)
Slightly above goal.  Previously well controlled.  Discussed home blood pressure monitoring with goal 140/90 or below.  Discussed lifestyle modifications including regular exercise and low-sodium diet.

## 2019-08-13 NOTE — Progress Notes (Signed)
Please inform patient of the following:  Blood sugar is very elevated and in the diabetic range. Would like for him to start metformin 750mg  daily and we should recheck in 3 months.   Cholesterol levels are also borderline - would like for him to continue working on diet and exercise and we can recheck in a year or so.  Jared Whitehead. Jerline Pain, MD 08/13/2019 10:33 AM

## 2019-08-14 ENCOUNTER — Other Ambulatory Visit: Payer: Self-pay

## 2019-08-14 MED ORDER — METFORMIN HCL ER 750 MG PO TB24: 750.0000 mg | ORAL_TABLET | Freq: Every day | ORAL | 6 refills | Status: DC

## 2020-01-16 ENCOUNTER — Ambulatory Visit (INDEPENDENT_AMBULATORY_CARE_PROVIDER_SITE_OTHER): Payer: Managed Care, Other (non HMO) | Admitting: Family Medicine

## 2020-01-16 DIAGNOSIS — J029 Acute pharyngitis, unspecified: Secondary | ICD-10-CM

## 2020-01-16 DIAGNOSIS — J309 Allergic rhinitis, unspecified: Secondary | ICD-10-CM | POA: Diagnosis not present

## 2020-01-16 DIAGNOSIS — R739 Hyperglycemia, unspecified: Secondary | ICD-10-CM

## 2020-01-16 MED ORDER — AMOXICILLIN 875 MG PO TABS: 875.0000 mg | ORAL_TABLET | Freq: Two times a day (BID) | ORAL | 0 refills | Status: DC

## 2020-01-16 NOTE — Assessment & Plan Note (Signed)
Continue Zyrtec 10 mg daily.  Recommend over-the-counter Flonase as well.  If continues to have issues may need referral to ENT.

## 2020-01-16 NOTE — Progress Notes (Signed)
   Jared Whitehead is a 39 y.o. male who presents today for a virtual office visit.  Assessment/Plan:  New/Acute Problems: Sore Throat Consistent with prior episodes of strep pharyngitis.  Will start empiric amoxicillin.  Encouraged good oral hydration.  He can continue using ibuprofen as well.  Chronic Problems Addressed Today: Hyperglycemia Continue Metformin 750 mg daily.  Discussed importance of good glycemic control.  Advised him to come in soon to recheck A1c.  Allergic rhinitis Continue Zyrtec 10 mg daily.  Recommend over-the-counter Flonase as well.  If continues to have issues may need referral to ENT.     Subjective:  HPI:  Symptoms started 2-3 weeks ago. Started having a sore throat and swelling in his tonsils. Currently hurts to swallow.  Feels similar to previous tonsil infections. Some nasal congestion. Some fevers as well.        Objective/Observations  Physical Exam: Gen: NAD, resting comfortably Pulm: Normal work of breathing Neuro: Grossly normal, moves all extremities Psych: Normal affect and thought content  Virtual Visit via Video   I connected with Jared Whitehead on 01/16/20 at  4:00 PM EST by a video enabled telemedicine application and verified that I am speaking with the correct person using two identifiers. The limitations of evaluation and management by telemedicine and the availability of in person appointments were discussed. The patient expressed understanding and agreed to proceed.   Patient location: Home Provider location: [place] [facility] Persons participating in the virtual visit: Myself and Patient     Katina Degree. Jimmey Ralph, MD 01/16/2020 2:32 PM

## 2020-01-16 NOTE — Assessment & Plan Note (Signed)
Continue Metformin 750 mg daily.  Discussed importance of good glycemic control.  Advised him to come in soon to recheck A1c.

## 2020-01-23 ENCOUNTER — Other Ambulatory Visit: Payer: Self-pay

## 2020-01-23 ENCOUNTER — Telehealth (INDEPENDENT_AMBULATORY_CARE_PROVIDER_SITE_OTHER): Payer: Managed Care, Other (non HMO) | Admitting: Physician Assistant

## 2020-01-23 ENCOUNTER — Encounter: Payer: Self-pay | Admitting: Physician Assistant

## 2020-01-23 VITALS — Ht 73.0 in | Wt 280.0 lb

## 2020-01-23 DIAGNOSIS — R21 Rash and other nonspecific skin eruption: Secondary | ICD-10-CM | POA: Diagnosis not present

## 2020-01-23 DIAGNOSIS — R739 Hyperglycemia, unspecified: Secondary | ICD-10-CM

## 2020-01-23 MED ORDER — FAMOTIDINE 20 MG PO TABS: 20.0000 mg | ORAL_TABLET | Freq: Every day | ORAL | 0 refills | Status: DC

## 2020-01-23 MED ORDER — TRIAMCINOLONE ACETONIDE 0.025 % EX CREA: TOPICAL_CREAM | CUTANEOUS | 0 refills | Status: DC

## 2020-01-23 NOTE — Progress Notes (Signed)
Virtual Visit via Video   I connected with [name] on 01/23/20 at  1:20 PM EST by a video enabled telemedicine application and verified that I am speaking with the correct person using two identifiers. Location patient: Home Location provider: [facility], Office Persons participating in the virtual visit: Jared Whitehead, Pellegrino Kennard PA-C, Anselmo Pickler, LPN   I discussed the limitations of evaluation and management by telemedicine and the availability of in person appointments. The patient expressed understanding and agreed to proceed.  I acted as a Education administrator for [employer], PA-C [employer], LPN  Subjective:   HPI:   Rash Patient was seen by PCP on 01/16/20 for presumed strep throat via virtual visit. Was prescribed amoxicillin.   Pt c/o rash on chest and arms x 1 week. Rash is red and blotchy. Denies itching. Pt has tried Benadryl but not sure if worked or not, Benadryl does make him sleepy.  He does take Zyrtec 10 mg daily and has for at least the past few weeks.  Pt was on Amoxicillin for the past week. Sore throat has resolved.  He denies any prior allergies or rashes to amoxicillin.  Denies any further fever, chills, nausea, vomiting, open lesions, changes to his environment.  Lab Results  Component Value Date   HGBA1C 11.0 (H) 08/12/2019     ROS: See pertinent positives and negatives per HPI.  Patient Active Problem List   Diagnosis Date Noted  . Snoring 02/12/2019  . Hyperglycemia 12/20/2018  . Elevated blood pressure reading 12/19/2018  . Erectile dysfunction 12/19/2018  . Allergic rhinitis 12/19/2018    Social History   Tobacco Use  . Smoking status: Never Smoker  . Smokeless tobacco: Never Used  Substance Use Topics  . Alcohol use: Yes    Current Outpatient Medications:  .  amoxicillin (AMOXIL) 875 MG tablet, Take 1 tablet (875 mg total) by mouth 2 (two) times daily., Disp: 14 tablet, Rfl: 0 .  cetirizine (ZYRTEC) 10 MG chewable tablet,  Chew 10 mg by mouth daily., Disp: , Rfl:  .  metFORMIN (GLUCOPHAGE-XR) 750 MG 24 hr tablet, Take 1 tablet (750 mg total) by mouth daily with breakfast., Disp: 30 tablet, Rfl: 6  No Known Allergies  Objective:   VITALS: Per patient if applicable, see vitals. GENERAL: Alert, appears well and in no acute distress. HEENT: Atraumatic, conjunctiva clear, no obvious abnormalities on inspection of external nose and ears. NECK: Normal movements of the head and neck. CARDIOPULMONARY: No increased WOB. Speaking in clear sentences. I:E ratio WNL.  MS: Moves all visible extremities without noticeable abnormality. PSYCH: Pleasant and cooperative, well-groomed. Speech normal rate and rhythm. Affect is appropriate. Insight and judgement are appropriate. Attention is focused, linear, and appropriate.  NEURO: CN grossly intact. Oriented as arrived to appointment on time with no prompting. Moves both UE equally.  SKIN: Erythematous raised scattered lesions to bilateral arms and abdomen        Assessment and Plan:   Jared Whitehead was seen today for rash.  Diagnoses and all orders for this visit:  Rash; Hyperglycemia Unclear etiology.  We did discuss that there was a possibility that maybe he actually had mono and the amoxicillin caused this rash.  He has completed his amoxicillin.  Overall rash is improving with time, however will have patient continue his Zyrtec 10 mg daily and add on Pepcid daily x1 week.  Because area is improving and because he has significant hyperglycemia, will defer oral steroids at this  time.  Will trial topical low potency triamcinolone for him to use up to two times daily.  Worsening precautions advised, no evidence of acute distress or airway compromise at this time.  Follow-up next week if no improvement of symptoms.  . Reviewed expectations re: course of current medical issues. . Discussed self-management of symptoms. . Outlined signs and symptoms indicating need for more acute  intervention. . Patient verbalized understanding and all questions were answered. Marland Kitchen Health Maintenance issues including appropriate healthy diet, exercise, and smoking avoidance were discussed with patient. . See orders for this visit as documented in the electronic medical record.  I discussed the assessment and treatment plan with the patient. The patient was provided an opportunity to ask questions and all were answered. The patient agreed with the plan and demonstrated an understanding of the instructions.   The patient was advised to call back or seek an in-person evaluation if the symptoms worsen or if the condition fails to improve as anticipated.   CMA or LPN served as scribe during this visit. History, Physical, and Plan performed by medical provider. The above documentation has been reviewed and is accurate and complete.   [place], [place] 01/23/2020

## 2020-01-23 NOTE — Progress Notes (Deleted)
Jared Whitehead is a 39 y.o. male here for a new problem.  I acted as a Neurosurgeon for [employer], PA-C [employer], LPN  History of Present Illness:   No chief complaint on file.   HPI  Rash  Past Medical History:  Diagnosis Date  . Allergy   . Asthma   . Hypertension      Social History   Socioeconomic History  . Marital status: Single    Spouse name: Not on file  . Number of children: Not on file  . Years of education: Not on file  . Highest education level: Not on file  Occupational History  . Not on file  Tobacco Use  . Smoking status: Never Smoker  . Smokeless tobacco: Never Used  Substance and Sexual Activity  . Alcohol use: Yes  . Drug use: Not on file  . Sexual activity: Never  Other Topics Concern  . Not on file  Social History Narrative  . Not on file   Social Determinants of Health   Financial Resource Strain:   . Difficulty of Paying Living Expenses: Not on file  Food Insecurity:   . Worried About Programme researcher, broadcasting/film/video in the Last Year: Not on file  . Ran Out of Food in the Last Year: Not on file  Transportation Needs:   . Lack of Transportation (Medical): Not on file  . Lack of Transportation (Non-Medical): Not on file  Physical Activity:   . Days of Exercise per Week: Not on file  . Minutes of Exercise per Session: Not on file  Stress:   . Feeling of Stress : Not on file  Social Connections:   . Frequency of Communication with Friends and Family: Not on file  . Frequency of Social Gatherings with Friends and Family: Not on file  . Attends Religious Services: Not on file  . Active Member of Clubs or Organizations: Not on file  . Attends Banker Meetings: Not on file  . Marital Status: Not on file  Intimate Partner Violence:   . Fear of Current or Ex-Partner: Not on file  . Emotionally Abused: Not on file  . Physically Abused: Not on file  . Sexually Abused: Not on file    No past surgical history on file.  Family  History  Adopted: Yes  Problem Relation Age of Onset  . Heart attack Father     No Known Allergies  Current Medications:   Current Outpatient Medications:  .  amoxicillin (AMOXIL) 875 MG tablet, Take 1 tablet (875 mg total) by mouth 2 (two) times daily., Disp: 14 tablet, Rfl: 0 .  cetirizine (ZYRTEC) 10 MG chewable tablet, Chew 10 mg by mouth daily., Disp: , Rfl:  .  metFORMIN (GLUCOPHAGE-XR) 750 MG 24 hr tablet, Take 1 tablet (750 mg total) by mouth daily with breakfast., Disp: 30 tablet, Rfl: 6   Review of Systems:   ROS  Vitals:   There were no vitals filed for this visit.   There is no height or weight on file to calculate BMI.  Physical Exam:   Physical Exam  Results for orders placed or performed in visit on 08/12/19  CBC  Result Value Ref Range   WBC 6.6 4.0 - 10.5 K/uL   RBC 5.33 4.22 - 5.81 Mil/uL   Platelets 225.0 150.0 - 400.0 K/uL   Hemoglobin 15.7 13.0 - 17.0 g/dL   HCT 30.1 60.1 - 09.3 %   MCV 86.2 78.0 - 100.0  fl   MCHC 34.3 30.0 - 36.0 g/dL   RDW 13.0 11.5 - 15.5 %  Comprehensive metabolic panel  Result Value Ref Range   Sodium 136 135 - 145 mEq/L   Potassium 4.3 3.5 - 5.1 mEq/L   Chloride 98 96 - 112 mEq/L   CO2 29 19 - 32 mEq/L   Glucose, Bld 286 (H) 70 - 99 mg/dL   BUN 11 6 - 23 mg/dL   Creatinine, Ser 0.95 0.40 - 1.50 mg/dL   Total Bilirubin 0.6 0.2 - 1.2 mg/dL   Alkaline Phosphatase 60 39 - 117 U/L   AST 20 0 - 37 U/L   ALT 33 0 - 53 U/L   Total Protein 7.2 6.0 - 8.3 g/dL   Albumin 4.3 3.5 - 5.2 g/dL   Calcium 9.4 8.4 - 10.5 mg/dL   GFR 88.45 >60.00 mL/min  Hemoglobin A1c  Result Value Ref Range   Hgb A1c MFr Bld 11.0 (H) 4.6 - 6.5 %  TSH  Result Value Ref Range   TSH 3.28 0.35 - 4.50 uIU/mL  Lipid panel  Result Value Ref Range   Cholesterol 169 0 - 200 mg/dL   Triglycerides 352.0 (H) 0.0 - 149.0 mg/dL   HDL 36.50 (L) >39.00 mg/dL   VLDL 70.4 (H) 0.0 - 40.0 mg/dL   Total CHOL/HDL Ratio 5    NonHDL 132.17   LDL cholesterol,  direct  Result Value Ref Range   Direct LDL 83.0 mg/dL    Assessment and Plan:   There are no diagnoses linked to this encounter.  . Reviewed expectations re: course of current medical issues. . Discussed self-management of symptoms. . Outlined signs and symptoms indicating need for more acute intervention. . Patient verbalized understanding and all questions were answered. . See orders for this visit as documented in the electronic medical record. . Patient received an After-Visit Summary.  ***  Inda Coke, PA-C

## 2020-03-05 ENCOUNTER — Ambulatory Visit: Payer: Managed Care, Other (non HMO) | Attending: Internal Medicine

## 2020-03-05 DIAGNOSIS — Z23 Encounter for immunization: Secondary | ICD-10-CM

## 2020-03-05 NOTE — Progress Notes (Signed)
   Covid-19 Vaccination Clinic  Name:  Jared Whitehead    MRN: [id number] DOB: 1981/01/13  03/05/2020  Mr. [name] was observed post Covid-19 immunization for 30 minutes based on pre-vaccination screening without incident. He was provided with Vaccine Information Sheet and instruction to access the V-Safe system.   Mr. [name] was instructed to call 911 with any severe reactions post vaccine: Marland Kitchen Difficulty breathing  . Swelling of face and throat  . A fast heartbeat  . A bad rash all over body  . Dizziness and weakness   Immunizations Administered    Name Date Dose VIS Date Route   Pfizer COVID-19 Vaccine 03/05/2020  3:15 PM 0.3 mL 11/07/2019 Intramuscular   Manufacturer: [employer], [employer]   Lot: [id number]   NDC: [address]-4

## 2020-03-31 ENCOUNTER — Ambulatory Visit: Payer: No Typology Code available for payment source | Attending: Internal Medicine

## 2020-03-31 DIAGNOSIS — Z23 Encounter for immunization: Secondary | ICD-10-CM

## 2020-03-31 NOTE — Progress Notes (Signed)
   Covid-19 Vaccination Clinic  Name:  Jared Whitehead    MRN: [id number] DOB: August 15, 1981  03/31/2020  Jared Whitehead was observed post Covid-19 immunization for 15 minutes without incident. He was provided with Vaccine Information Sheet and instruction to access the V-Safe system.   Jared Whitehead was instructed to call 911 with any severe reactions post vaccine: Marland Kitchen Difficulty breathing  . Swelling of face and throat  . A fast heartbeat  . A bad rash all over body  . Dizziness and weakness   Immunizations Administered    Name Date Dose VIS Date Route   Pfizer COVID-19 Vaccine 03/31/2020  3:04 PM 0.3 mL 01/21/2019 Intramuscular   Manufacturer: [employer], [employer]   Lot: [id number]   NDC: [address]-9

## 2020-08-13 ENCOUNTER — Encounter: Payer: BC Managed Care – PPO | Admitting: Family Medicine

## 2020-09-07 ENCOUNTER — Encounter: Payer: No Typology Code available for payment source | Admitting: Family Medicine

## 2020-10-28 ENCOUNTER — Other Ambulatory Visit: Payer: Self-pay

## 2020-10-28 ENCOUNTER — Ambulatory Visit (INDEPENDENT_AMBULATORY_CARE_PROVIDER_SITE_OTHER): Payer: No Typology Code available for payment source | Admitting: Family Medicine

## 2020-10-28 ENCOUNTER — Encounter: Payer: Self-pay | Admitting: Family Medicine

## 2020-10-28 VITALS — BP 152/96 | HR 93 | Temp 97.2°F | Ht 73.0 in | Wt 283.0 lb

## 2020-10-28 DIAGNOSIS — Z0001 Encounter for general adult medical examination with abnormal findings: Secondary | ICD-10-CM

## 2020-10-28 DIAGNOSIS — R739 Hyperglycemia, unspecified: Secondary | ICD-10-CM

## 2020-10-28 DIAGNOSIS — Z13220 Encounter for screening for lipoid disorders: Secondary | ICD-10-CM

## 2020-10-28 DIAGNOSIS — R030 Elevated blood-pressure reading, without diagnosis of hypertension: Secondary | ICD-10-CM | POA: Diagnosis not present

## 2020-10-28 DIAGNOSIS — E669 Obesity, unspecified: Secondary | ICD-10-CM

## 2020-10-28 DIAGNOSIS — Z6837 Body mass index (BMI) 37.0-37.9, adult: Secondary | ICD-10-CM

## 2020-10-28 NOTE — Assessment & Plan Note (Signed)
Slightly above goal.  Asymptomatic.  Has previously been well controlled.  Advised him to continue home blood pressure monitoring with goal 140/90 or lower.  Discussed lifestyle modifications.

## 2020-10-28 NOTE — Patient Instructions (Signed)
It was very nice to see you today!  We will check blood work today.  Please keep working on your diet and exercise.  It is very important that we keep your blood sugar in good control.  We may need to make some changes to your medications depending on the result.  Please keep an eye on your blood pressure and let me know if persistently 140/90 or higher.  I will see you back in a year for you next physical, please come back to see me sooner if needed.   Take care, Dr Jerline Pain  Please try these tips to maintain a healthy lifestyle:   Eat at least 3 REAL meals and 1-2 snacks per day.  Aim for no more than 5 hours between eating.  If you eat breakfast, please do so within one hour of getting up.    Each meal should contain half fruits/vegetables, one quarter protein, and one quarter carbs (no bigger than a computer mouse)   Cut down on sweet beverages. This includes juice, soda, and sweet tea.     Drink at least 1 glass of water with each meal and aim for at least 8 glasses per day   Exercise at least 150 minutes every week.    Preventive Care 68-1 Years Old, Male Preventive care refers to lifestyle choices and visits with your health care provider that can promote health and wellness. This includes:  A yearly physical exam. This is also called an annual well check.  Regular dental and eye exams.  Immunizations.  Screening for certain conditions.  Healthy lifestyle choices, such as eating a healthy diet, getting regular exercise, not using drugs or products that contain nicotine and tobacco, and limiting alcohol use. What can I expect for my preventive care visit? Physical exam Your health care provider will check:  Height and weight. These may be used to calculate body mass index (BMI), which is a measurement that tells if you are at a healthy weight.  Heart rate and blood pressure.  Your skin for abnormal spots. Counseling Your health care provider may ask you  questions about:  Alcohol, tobacco, and drug use.  Emotional well-being.  Home and relationship well-being.  Sexual activity.  Eating habits.  Work and work Statistician. What immunizations do I need?  Influenza (flu) vaccine  This is recommended every year. Tetanus, diphtheria, and pertussis (Tdap) vaccine  You may need a Td booster every 10 years. Varicella (chickenpox) vaccine  You may need this vaccine if you have not already been vaccinated. Human papillomavirus (HPV) vaccine  If recommended by your health care provider, you may need three doses over 6 months. Measles, mumps, and rubella (MMR) vaccine  You may need at least one dose of MMR. You may also need a second dose. Meningococcal conjugate (MenACWY) vaccine  One dose is recommended if you are 44-65 years old and a Market researcher living in a residence hall, or if you have one of several medical conditions. You may also need additional booster doses. Pneumococcal conjugate (PCV13) vaccine  You may need this if you have certain conditions and were not previously vaccinated. Pneumococcal polysaccharide (PPSV23) vaccine  You may need one or two doses if you smoke cigarettes or if you have certain conditions. Hepatitis A vaccine  You may need this if you have certain conditions or if you travel or work in places where you may be exposed to hepatitis A. Hepatitis B vaccine  You may need this if you  have certain conditions or if you travel or work in places where you may be exposed to hepatitis B. Haemophilus influenzae type b (Hib) vaccine  You may need this if you have certain risk factors. You may receive vaccines as individual doses or as more than one vaccine together in one shot (combination vaccines). Talk with your health care provider about the risks and benefits of combination vaccines. What tests do I need? Blood tests  Lipid and cholesterol levels. These may be checked every 5 years  starting at age 60.  Hepatitis C test.  Hepatitis B test. Screening   Diabetes screening. This is done by checking your blood sugar (glucose) after you have not eaten for a while (fasting).  Sexually transmitted disease (STD) testing. Talk with your health care provider about your test results, treatment options, and if necessary, the need for more tests. Follow these instructions at home: Eating and drinking   Eat a diet that includes fresh fruits and vegetables, whole grains, lean protein, and low-fat dairy products.  Take vitamin and mineral supplements as recommended by your health care provider.  Do not drink alcohol if your health care provider tells you not to drink.  If you drink alcohol: ? Limit how much you have to 0-2 drinks a day. ? Be aware of how much alcohol is in your drink. In the [place] one drink equals one 12 oz bottle of beer (355 mL), one 5 oz glass of wine (148 mL), or one 1 oz glass of hard liquor (44 mL). Lifestyle  Take daily care of your teeth and gums.  Stay active. Exercise for at least 30 minutes on 5 or more days each week.  Do not use any products that contain nicotine or tobacco, such as cigarettes, e-cigarettes, and chewing tobacco. If you need help quitting, ask your health care provider.  If you are sexually active, practice safe sex. Use a condom or other form of protection to prevent STIs (sexually transmitted infections). What's next?  Go to your health care provider once a year for a well check visit.  Ask your health care provider how often you should have your eyes and teeth checked.  Stay up to date on all vaccines. This information is not intended to replace advice given to you by your health care provider. Make sure you discuss any questions you have with your health care provider. Document Revised: 11/07/2018 Document Reviewed: 11/07/2018 Elsevier Patient Education  2020 [employer].

## 2020-10-28 NOTE — Progress Notes (Signed)
Chief Complaint:  Jared Whitehead is a 39 y.o. male who presents today for his annual comprehensive physical exam.    Assessment/Plan:  New/Acute Problems: Right elbow pain Located on point of elbow and consistent with olecranon bursitis.  Symptoms are resolved.  Discussed preventative measures including not resting elbow on hard surfaces.  Given symptoms are resolved do not need to pursue further work-up at this point.  Chronic Problems Addressed Today: Hyperglycemia He has been off Metformin for the last couple months.  Discussed importance of good glycemic control.  Will check A1c today and likely need to restart Metformin.  Elevated blood pressure reading Slightly above goal.  Asymptomatic.  Has previously been well controlled.  Advised him to continue home blood pressure monitoring with goal 140/90 or lower.  Discussed lifestyle modifications.   Body mass index is 37.34 kg/m. / Obese  BMI Metric Follow Up - 10/28/20 1353      BMI Metric Follow Up-Please document annually   BMI Metric Follow Up Education provided            Preventative Healthcare: Has received COVID vaccine.  Check CBC, CMET, TSH, lipid panel.  Patient Counseling(The following topics were reviewed and/or handout was given):  -Nutrition: Stressed importance of moderation in sodium/caffeine intake, saturated fat and cholesterol, caloric balance, sufficient intake of fresh fruits, vegetables, and fiber.  -Stressed the importance of regular exercise.   -Substance Abuse: Discussed cessation/primary prevention of tobacco, alcohol, or other drug use; driving or other dangerous activities under the influence; availability of treatment for abuse.   -Injury prevention: Discussed safety belts, safety helmets, smoke detector, smoking near bedding or upholstery.   -Sexuality: Discussed sexually transmitted diseases, partner selection, use of condoms, avoidance of unintended pregnancy and contraceptive alternatives.    -Dental health: Discussed importance of regular tooth brushing, flossing, and dental visits.  -Health maintenance and immunizations reviewed. Please refer to Health maintenance section.  Return to care in 1 year for next preventative visit.     Subjective:  HPI:  He has no acute complaints today.   Lifestyle Diet: None specific.  Exercise: None specific.   Depression screen PHQ 2/9 10/28/2020  Decreased Interest 0  Down, Depressed, Hopeless 0  PHQ - 2 Score 0   Health Maintenance Due  Topic Date Due  . Hepatitis C Screening  Never done  . HIV Screening  Never done    ROS: Per HPI, otherwise a complete review of systems was negative.   PMH:  The following were reviewed and entered/updated in epic: Past Medical History:  Diagnosis Date  . Allergy   . Asthma   . Hypertension    Patient Active Problem List   Diagnosis Date Noted  . Snoring 02/12/2019  . Hyperglycemia 12/20/2018  . Elevated blood pressure reading 12/19/2018  . Erectile dysfunction 12/19/2018  . Allergic rhinitis 12/19/2018   History reviewed. No pertinent surgical history.  Family History  Adopted: Yes  Problem Relation Age of Onset  . Heart attack Father     Medications- reviewed and updated Current Outpatient Medications  Medication Sig Dispense Refill  . cetirizine (ZYRTEC) 10 MG chewable tablet Chew 10 mg by mouth daily.    . metFORMIN (GLUCOPHAGE-XR) 750 MG 24 hr tablet Take 1 tablet (750 mg total) by mouth daily with breakfast. (Patient not taking: Reported on 10/28/2020) 30 tablet 6   No current facility-administered medications for this visit.    Allergies-reviewed and updated No Known Allergies  Social History  Socioeconomic History  . Marital status: Single    Spouse name: Not on file  . Number of children: Not on file  . Years of education: Not on file  . Highest education level: Not on file  Occupational History  . Not on file  Tobacco Use  . Smoking status: Never  Smoker  . Smokeless tobacco: Never Used  Vaping Use  . Vaping Use: Never used  Substance and Sexual Activity  . Alcohol use: Yes  . Drug use: Never  . Sexual activity: Never  Other Topics Concern  . Not on file  Social History Narrative  . Not on file   Social Determinants of Health   Financial Resource Strain:   . Difficulty of Paying Living Expenses: Not on file  Food Insecurity:   . Worried About Programme researcher, broadcasting/film/video in the Last Year: Not on file  . Ran Out of Food in the Last Year: Not on file  Transportation Needs:   . Lack of Transportation (Medical): Not on file  . Lack of Transportation (Non-Medical): Not on file  Physical Activity:   . Days of Exercise per Week: Not on file  . Minutes of Exercise per Session: Not on file  Stress:   . Feeling of Stress : Not on file  Social Connections:   . Frequency of Communication with Friends and Family: Not on file  . Frequency of Social Gatherings with Friends and Family: Not on file  . Attends Religious Services: Not on file  . Active Member of Clubs or Organizations: Not on file  . Attends Banker Meetings: Not on file  . Marital Status: Not on file        Objective:  Physical Exam: BP (!) 152/96   Pulse 93   Temp (!) 97.2 F (36.2 C) (Temporal)   Ht 6\' 1"  (1.854 m)   Wt 283 lb (128.4 kg)   SpO2 98%   BMI 37.34 kg/m   Body mass index is 37.34 kg/m. Wt Readings from Last 3 Encounters:  10/28/20 283 lb (128.4 kg)  01/23/20 280 lb (127 kg)  08/12/19 282 lb 4 oz (128 kg)   Gen: NAD, resting comfortably HEENT: TMs normal bilaterally. OP clear. No thyromegaly noted.  CV: RRR with no murmurs appreciated Pulm: NWOB, CTAB with no crackles, wheezes, or rhonchi GI: Normal bowel sounds present. Soft, Nontender, Nondistended. MSK: no edema, cyanosis, or clubbing noted Skin: warm, dry Neuro: CN2-12 grossly intact. Strength 5/5 in upper and lower extremities. Reflexes symmetric and intact bilaterally.    Psych: Normal affect and thought content     Scarlettrose Costilow M. 08/14/19, MD 10/28/2020 1:54 PM

## 2020-10-28 NOTE — Assessment & Plan Note (Signed)
He has been off Metformin for the last couple months.  Discussed importance of good glycemic control.  Will check A1c today and likely need to restart Metformin.

## 2020-10-29 LAB — COMPREHENSIVE METABOLIC PANEL
AG Ratio: 1.7 (calc) (ref 1.0–2.5)
ALT: 32 U/L (ref 9–46)
AST: 20 U/L (ref 10–40)
Albumin: 4.6 g/dL (ref 3.6–5.1)
Alkaline phosphatase (APISO): 67 U/L (ref 36–130)
BUN: 12 mg/dL (ref 7–25)
CO2: 25 mmol/L (ref 20–32)
Calcium: 9.7 mg/dL (ref 8.6–10.3)
Chloride: 100 mmol/L (ref 98–110)
Creat: 1.01 mg/dL (ref 0.60–1.35)
Globulin: 2.7 g/dL (calc) (ref 1.9–3.7)
Glucose, Bld: 370 mg/dL — ABNORMAL HIGH (ref 65–99)
Potassium: 4.9 mmol/L (ref 3.5–5.3)
Sodium: 138 mmol/L (ref 135–146)
Total Bilirubin: 0.6 mg/dL (ref 0.2–1.2)
Total Protein: 7.3 g/dL (ref 6.1–8.1)

## 2020-10-29 LAB — CBC
HCT: 43.6 % (ref 38.5–50.0)
Hemoglobin: 14.9 g/dL (ref 13.2–17.1)
MCH: 29.4 pg (ref 27.0–33.0)
MCHC: 34.2 g/dL (ref 32.0–36.0)
MCV: 86.2 fL (ref 80.0–100.0)
MPV: 11.2 fL (ref 7.5–12.5)
Platelets: 231 10*3/uL (ref 140–400)
RBC: 5.06 10*6/uL (ref 4.20–5.80)
RDW: 13 % (ref 11.0–15.0)
WBC: 6.3 10*3/uL (ref 3.8–10.8)

## 2020-10-29 LAB — LIPID PANEL
Cholesterol: 165 mg/dL (ref <200)
HDL: 41 mg/dL (ref >=40)
LDL Cholesterol (Calc): 80 mg/dL (calc)
Non-HDL Cholesterol (Calc): 124 mg/dL (calc) (ref <130)
Total CHOL/HDL Ratio: 4 (calc) (ref <5.0)
Triglycerides: 364 mg/dL — ABNORMAL HIGH (ref <150)

## 2020-10-29 LAB — HEMOGLOBIN A1C
Hgb A1c MFr Bld: 10.8 % of total Hgb — ABNORMAL HIGH (ref <5.7)
Mean Plasma Glucose: 263 (calc)
eAG (mmol/L): 14.6 (calc)

## 2020-10-29 LAB — TSH: TSH: 2.24 mIU/L (ref 0.40–4.50)

## 2020-11-01 NOTE — Progress Notes (Signed)
Please inform patient of the following:  Blood sugar is very high. We need to get him back on metformin. Would like for him to take 500mg  twice daily for 2 weeks and then increase to 1000mg  twice daily. I would like to see him back in 3 months to recheck A1c.   All of his other labs are stable.  . , MD 11/01/2020 2:58 PM

## 2020-11-15 ENCOUNTER — Ambulatory Visit: Payer: No Typology Code available for payment source | Admitting: Family Medicine

## 2020-11-29 ENCOUNTER — Encounter: Payer: Self-pay | Admitting: Family Medicine

## 2020-11-29 ENCOUNTER — Telehealth (INDEPENDENT_AMBULATORY_CARE_PROVIDER_SITE_OTHER): Payer: No Typology Code available for payment source | Admitting: Family Medicine

## 2020-11-29 VITALS — Ht 73.0 in | Wt 278.0 lb

## 2020-11-29 DIAGNOSIS — L84 Corns and callosities: Secondary | ICD-10-CM | POA: Diagnosis not present

## 2020-11-29 DIAGNOSIS — R739 Hyperglycemia, unspecified: Secondary | ICD-10-CM

## 2020-11-29 MED ORDER — METFORMIN HCL 500 MG PO TABS: 1000.0000 mg | ORAL_TABLET | Freq: Two times a day (BID) | ORAL | 3 refills | Status: DC

## 2020-11-29 NOTE — Assessment & Plan Note (Signed)
Last A1c 10.8.  Will restart Metformin 1000 mg twice daily.  He will do 500 mg twice daily for 1 to 2 weeks and then increase to full dose.  Follow-up recheck A1c in 3 months.

## 2020-11-29 NOTE — Progress Notes (Signed)
   Jared Whitehead is a 40 y.o. male who presents today for a telephone visit.  Assessment/Plan:  New/Acute Problems: Corn of Foot Will place referral to podiatry.  Chronic Problems Addressed Today: Hyperglycemia Last A1c 10.8.  Will restart Metformin 1000 mg twice daily.  He will do 500 mg twice daily for 1 to 2 weeks and then increase to full dose.  Follow-up recheck A1c in 3 months.     Subjective:  HPI:  See A/p.  Patient here recently had labs done.  He would also like to have referral to podiatry.       Objective/Observations   NAD  Telephone Visit   I connected with Teo Moede Crutchfield on 11/29/20 at  3:00 PM EST via telephone and verified that I am speaking with the correct person using two identifiers. I discussed the limitations of evaluation and management by telemedicine and the availability of in person appointments. The patient expressed understanding and agreed to proceed.   Patient location: Home Provider location: [facility] [employer] Persons participating in the virtual visit: Myself and Patient  A total of 11 minutes were spent on medical discussion.      Katina Degree. Jimmey Ralph, MD 11/29/2020 3:34 PM

## 2020-12-23 ENCOUNTER — Ambulatory Visit (INDEPENDENT_AMBULATORY_CARE_PROVIDER_SITE_OTHER): Payer: No Typology Code available for payment source | Admitting: Podiatry

## 2020-12-23 ENCOUNTER — Other Ambulatory Visit: Payer: Self-pay

## 2020-12-23 DIAGNOSIS — L84 Corns and callosities: Secondary | ICD-10-CM

## 2020-12-23 DIAGNOSIS — R52 Pain, unspecified: Secondary | ICD-10-CM | POA: Diagnosis not present

## 2020-12-26 ENCOUNTER — Encounter: Payer: Self-pay | Admitting: Podiatry

## 2020-12-26 NOTE — Progress Notes (Signed)
  Subjective:  Patient ID: Jared Whitehead, male    DOB: 07/20/1981,  MRN: [id number]  Chief Complaint  Patient presents with  . Callouses    Bilateral callus hallux and heel Skin is dry and cracked     40 y.o. male presents with the above complaint. History confirmed with patient. Does not currently use anything for them  Objective:  Physical Exam: warm, good capillary refill, no trophic changes or ulcerative lesions, normal DP and PT pulses and normal sensory exam. Bilateral medial hallux, lateral R heel, and SM 5 on L porokeratoses  Assessment:   1. Callus of foot   2. Pain      Plan:  Patient was evaluated and treated and all questions answered.  All symptomatic hyperkeratoses were safely debrided with a sterile #15 blade to patient's level of comfort without incident. We discussed preventative and palliative care of these lesions including supportive and accommodative shoegear, padding, prefabricated and custom molded accommodative orthoses, use of a pumice stone and lotions/creams daily.   Return if symptoms worsen or fail to improve.

## 2021-10-28 ENCOUNTER — Encounter: Payer: Self-pay | Admitting: Physician Assistant

## 2021-10-28 ENCOUNTER — Telehealth (INDEPENDENT_AMBULATORY_CARE_PROVIDER_SITE_OTHER): Payer: No Typology Code available for payment source | Admitting: Physician Assistant

## 2021-10-28 DIAGNOSIS — E1165 Type 2 diabetes mellitus with hyperglycemia: Secondary | ICD-10-CM | POA: Diagnosis not present

## 2021-10-28 DIAGNOSIS — J029 Acute pharyngitis, unspecified: Secondary | ICD-10-CM | POA: Diagnosis not present

## 2021-10-28 DIAGNOSIS — J309 Allergic rhinitis, unspecified: Secondary | ICD-10-CM

## 2021-10-28 MED ORDER — AMOXICILLIN 500 MG PO CAPS: 500.0000 mg | ORAL_CAPSULE | Freq: Two times a day (BID) | ORAL | 0 refills | Status: AC

## 2021-10-28 MED ORDER — FLUTICASONE PROPIONATE 50 MCG/ACT NA SUSP: 2.0000 | Freq: Every day | NASAL | 2 refills | Status: DC

## 2021-10-28 NOTE — Progress Notes (Signed)
Virtual Visit via Video Note  I connected with  Jared Whitehead  on 10/28/21 at 11:30 AM EST by a video enabled telemedicine application and verified that I am speaking with the correct person using two identifiers.  Location: Patient: home Provider: Nature conservation officer at [employer] Persons present: Patient and myself   I discussed the limitations of evaluation and management by telemedicine and the availability of in person appointments. The patient expressed understanding and agreed to proceed.   History of Present Illness:  Chief complaint: Allergy-symptoms Symptom onset: 1 week ago Pertinent positives: Congestion, stuffy nose, ST, postnasal drip, left eye watering (better now), headache Pertinent negatives: Cough, fever, chills, body aches, N/V/D Treatments tried: Benadryl, Zyrtec, Mucinex Sick exposure: No known sick contacts    Observations/Objective:   Gen: Awake, alert, no acute distress Resp: Breathing is even and non-labored Psych: calm/pleasant demeanor Neuro: Alert and Oriented x 3, + facial symmetry, speech is clear.   Assessment and Plan:  1. Allergic rhinitis, unspecified seasonality, unspecified trigger 2. Acute pharyngitis, unspecified etiology -Prone to strep per patient, concerned he has this again -Rx amoxicillin to take as directed -Flonase, nasal saline, fluids, Zyrtec encouraged -Recheck in person if worse or no improvement  3. Uncontrolled type 2 diabetes mellitus with hyperglycemia (HCC) -He has not been compliant with follow-ups -Expressed importance of calling to schedule f/up and get this under control, otherwise risk early morbidity and mortality  Lab Results  Component Value Date   HGBA1C 10.8 (H) 10/28/2020     Follow Up Instructions:    I discussed the assessment and treatment plan with the patient. The patient was provided an opportunity to ask questions and all were answered. The patient agreed with the plan and  demonstrated an understanding of the instructions.   The patient was advised to call back or seek an in-person evaluation if the symptoms worsen or if the condition fails to improve as anticipated.  Total time on the phone was 6 minutes and 5 seconds. Video connection was lost at >50% of the duration of the visit, at which time the remainder of the visit was completed via audio only.   Labella Zahradnik M Krissy Orebaugh, PA-C

## 2021-10-28 NOTE — Patient Instructions (Signed)
Please call to schedule a follow up on your diabetes right away!

## 2022-08-21 ENCOUNTER — Encounter: Payer: Self-pay | Admitting: *Deleted

## 2022-11-09 ENCOUNTER — Encounter: Payer: Self-pay | Admitting: *Deleted

## 2023-05-23 ENCOUNTER — Encounter (HOSPITAL_COMMUNITY): Payer: Self-pay | Admitting: Emergency Medicine

## 2023-05-23 ENCOUNTER — Emergency Department (HOSPITAL_COMMUNITY)
Admission: EM | Admit: 2023-05-23 | Discharge: 2023-05-23 | Disposition: A | Discharge status: Home / Self Care | Payer: No Typology Code available for payment source | Attending: Emergency Medicine | Admitting: Emergency Medicine

## 2023-05-23 ENCOUNTER — Encounter: Payer: Self-pay | Admitting: Gastroenterology

## 2023-05-23 ENCOUNTER — Emergency Department (HOSPITAL_COMMUNITY): Payer: No Typology Code available for payment source

## 2023-05-23 ENCOUNTER — Other Ambulatory Visit: Payer: Self-pay

## 2023-05-23 DIAGNOSIS — K921 Melena: Secondary | ICD-10-CM | POA: Insufficient documentation

## 2023-05-23 LAB — CBC WITH DIFFERENTIAL/PLATELET
Abs Immature Granulocytes: 0 10*3/uL (ref 0.00–0.07)
Basophils Absolute: 0.1 10*3/uL (ref 0.0–0.1)
Basophils Relative: 1 %
Eosinophils Absolute: 0.2 10*3/uL (ref 0.0–0.5)
Eosinophils Relative: 4 %
HCT: 43.2 % (ref 39.0–52.0)
Hemoglobin: 15.1 g/dL (ref 13.0–17.0)
Immature Granulocytes: 0 %
Lymphocytes Relative: 30 %
Lymphs Abs: 1.6 10*3/uL (ref 0.7–4.0)
MCH: 29.7 pg (ref 26.0–34.0)
MCHC: 35 g/dL (ref 30.0–36.0)
MCV: 85 fL (ref 80.0–100.0)
Monocytes Absolute: 0.4 10*3/uL (ref 0.1–1.0)
Monocytes Relative: 7 %
Neutro Abs: 3.1 10*3/uL (ref 1.7–7.7)
Neutrophils Relative %: 58 %
Platelets: 208 10*3/uL (ref 150–400)
RBC: 5.08 MIL/uL (ref 4.22–5.81)
RDW: 12.1 % (ref 11.5–15.5)
WBC: 5.4 10*3/uL (ref 4.0–10.5)
nRBC: 0 % (ref 0.0–0.2)

## 2023-05-23 LAB — COMPREHENSIVE METABOLIC PANEL
ALT: 31 U/L (ref 0–44)
AST: 25 U/L (ref 15–41)
Albumin: 4 g/dL (ref 3.5–5.0)
Alkaline Phosphatase: 58 U/L (ref 38–126)
Anion gap: 14 (ref 5–15)
BUN: 11 mg/dL (ref 6–20)
CO2: 25 mmol/L (ref 22–32)
Calcium: 9.4 mg/dL (ref 8.9–10.3)
Chloride: 96 mmol/L — ABNORMAL LOW (ref 98–111)
Creatinine, Ser: 0.96 mg/dL (ref 0.61–1.24)
GFR, Estimated: >60 mL/min (ref >60)
Glucose, Bld: 307 mg/dL — ABNORMAL HIGH (ref 70–99)
Potassium: 4.3 mmol/L (ref 3.5–5.1)
Sodium: 135 mmol/L (ref 135–145)
Total Bilirubin: 0.9 mg/dL (ref 0.3–1.2)
Total Protein: 7.6 g/dL (ref 6.5–8.1)

## 2023-05-23 MED ORDER — IOHEXOL 350 MG/ML SOLN
75.0000 mL | Freq: Once | INTRAVENOUS | Status: AC | PRN
  Administered 2023-05-23: 75 mL via INTRAVENOUS

## 2023-05-23 NOTE — ED Provider Notes (Signed)
[facility] AT [facility] Provider Note   CSN: [id number] Arrival date & time: 05/23/23  2952     History  Chief Complaint  Patient presents with   Blood In Stools    Jared Whitehead is a 42 y.o. male.  HPI Patient presents with concern of blood in stool.  For the past few days patient has had increased frequency of bowel movements as well as new visible bright red blood per rectum.  No abdominal pain, no lightheadedness, no chest pain, no dyspnea or other complaints.  He notes 1 prior episode several years ago, that resolved with dietary changes.  He is otherwise well, denies recent change in medication, diet, activity.  He has not yet seen GI, nor had a colonoscopy.    Home Medications Prior to Admission medications   Medication Sig Start Date End Date Taking? Authorizing Provider  cetirizine (ZYRTEC) 10 MG chewable tablet Chew 10 mg by mouth daily.    [provider]  fluticasone (FLONASE) 50 MCG/ACT nasal spray Place 2 sprays into both nostrils daily. 10/28/21 11/27/21  Allwardt, Crist Infante, PA-C  metFORMIN (GLUCOPHAGE) 500 MG tablet Take 2 tablets (1,000 mg total) by mouth 2 (two) times daily with a meal. 11/29/20   Ardith Dark, MD      Allergies    Patient has no known allergies.    Review of Systems   Review of Systems  All other systems reviewed and are negative.   Physical Exam Updated Vital Signs BP (!) 136/90   Pulse 85   Temp 98 F (36.7 C) (Oral)   Resp 16   Ht 6\' 1"  (1.854 m)   Wt 124.7 kg   SpO2 98%   BMI 36.28 kg/m  Physical Exam Vitals and nursing note reviewed. Exam conducted with a chaperone present.  Constitutional:      General: He is not in acute distress.    Appearance: He is well-developed.  HENT:     Head: Normocephalic and atraumatic.  Eyes:     Conjunctiva/sclera: Conjunctivae normal.  Cardiovascular:     Rate and Rhythm: Normal rate and regular rhythm.  Pulmonary:     Effort: Pulmonary effort is  normal. No respiratory distress.     Breath sounds: No stridor.  Abdominal:     General: There is no distension.     Tenderness: There is no abdominal tenderness. There is no guarding or rebound.  Genitourinary:   Skin:    General: Skin is warm and dry.  Neurological:     Mental Status: He is alert and oriented to person, place, and time.     ED Results / Procedures / Treatments   Labs (all labs ordered are listed, but only abnormal results are displayed) Labs Reviewed  COMPREHENSIVE METABOLIC PANEL - Abnormal; Notable for the following components:      Result Value   Chloride 96 (*)    Glucose, Bld 307 (*)    All other components within normal limits  CBC WITH DIFFERENTIAL/PLATELET    EKG None  Radiology CT ABDOMEN PELVIS W CONTRAST  Result Date: 05/23/2023 CLINICAL DATA:  Diverticulitis, complication suspected. Blood in stool. Also hematuria EXAM: CT ABDOMEN AND PELVIS WITH CONTRAST TECHNIQUE: Multidetector CT imaging of the abdomen and pelvis was performed using the standard protocol following bolus administration of intravenous contrast. RADIATION DOSE REDUCTION: This exam was performed according to the departmental dose-optimization program which includes automated exposure control, adjustment of the mA and/or kV  according to patient size and/or use of iterative reconstruction technique. CONTRAST:  75mL OMNIPAQUE IOHEXOL 350 MG/ML SOLN COMPARISON:  None Available. FINDINGS: Lower chest: Normal Hepatobiliary: Steatosis of the liver without focal lesion. No calcified gallstones. Pancreas: Normal Spleen: Normal Adrenals/Urinary Tract: Adrenal glands are normal. Right kidney is normal. Left kidney is normal except for a nonobstructing 3 mm stone in the lower pole. Ureters are normal. Bladder is normal. Stomach/Bowel: Stomach and small intestine are normal. Normal appendix. Normal appearance of the colon. No evidence of diverticulosis or diverticulitis. Normal amount stool and fecal  matter in the colon. The patient does have presacral edema, etiology uncertain. No CT evidence of prostatitis or proctitis. Vascular/Lymphatic: Minimal aortic atherosclerosis. No aneurysm. IVC is normal. No adenopathy. Reproductive: Normal Other: No free fluid or air.  No hernia. 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 IMPRESSION: 1. No evidence of diverticulosis or diverticulitis. Normal amount of stool and fecal matter in the colon. 2. Steatosis of the liver without focal lesion. 3. 3 mm nonobstructing stone in the lower pole of the left kidney. 4. Nonspecific presacral edema, etiology uncertain. No CT evidence of prostatitis or proctitis. This could possibly represent benign mesenteritis, which can be seen following GI infection or be associated with an autoimmune process. Occasionally this can be symptomatic but is usually self-limited. 5. 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 6. Minimal aortic atherosclerosis. Aortic Atherosclerosis (ICD10-I70.0). Electronically Signed   By: Paulina Fusi M.D.   On: 05/23/2023 10:49    Procedures Procedures    Medications Ordered in ED Medications  iohexol (OMNIPAQUE) 350 MG/ML injection 75 mL (75 mLs Intravenous Contrast Given 05/23/23 1037)    ED Course/ Medical Decision Making/ A&P                             Medical Decision Making Generally well adult male presents with bright red blood per rectum and increased frequency of stool.  Concern for diverticulosis versus mass, or other intra-abdominal processes. Cardiac 85 sinus normal Pulse ox 100% room air normal CT, labs, reviewed, hemoglobin stable, CT with some nonspecific fluid, otherwise unremarkable, no diverticulitis, perforation. Patient has remained hemodynamically unremarkable for hours of monitoring, will follow-up with GI.  Amount and/or Complexity of Data Reviewed External Data Reviewed: notes. Labs: ordered.  Decision-making details documented in ED Course. Radiology: ordered and independent interpretation performed. Decision-making details documented in ED Course.  Risk Prescription drug management. Decision regarding hospitalization.  Final Clinical Impression(s) / ED Diagnoses Final diagnoses:  Blood in stool    Rx / DC Orders ED Discharge Orders     None         Gerhard Munch, MD 05/23/23 1112

## 2023-05-23 NOTE — Discharge Instructions (Signed)
Please be sure to schedule follow-up with our gastroenterology colleagues.  Return here for concerning changes in your condition.

## 2023-05-23 NOTE — ED Triage Notes (Signed)
Patient arrives ambulatory by POV c/o hematuria x 1 week. Denies any trauma or dysuria. Has not taken BP meds this morning.

## 2023-05-23 NOTE — ED Notes (Signed)
Patient provided urine cup and requested specimen. Patient states unable to provide one at this time.

## 2023-05-25 ENCOUNTER — Encounter: Payer: Self-pay | Admitting: Family Medicine

## 2023-05-25 ENCOUNTER — Ambulatory Visit (INDEPENDENT_AMBULATORY_CARE_PROVIDER_SITE_OTHER): Payer: No Typology Code available for payment source | Admitting: Family Medicine

## 2023-05-25 VITALS — BP 130/82 | HR 91 | Temp 98.4°F | Ht 73.0 in | Wt 276.0 lb

## 2023-05-25 DIAGNOSIS — Z0000 Encounter for general adult medical examination without abnormal findings: Secondary | ICD-10-CM | POA: Diagnosis not present

## 2023-05-25 DIAGNOSIS — Z1159 Encounter for screening for other viral diseases: Secondary | ICD-10-CM | POA: Diagnosis not present

## 2023-05-25 DIAGNOSIS — M25511 Pain in right shoulder: Secondary | ICD-10-CM

## 2023-05-25 DIAGNOSIS — Z13220 Encounter for screening for lipoid disorders: Secondary | ICD-10-CM | POA: Diagnosis not present

## 2023-05-25 DIAGNOSIS — Z0001 Encounter for general adult medical examination with abnormal findings: Secondary | ICD-10-CM

## 2023-05-25 DIAGNOSIS — Z23 Encounter for immunization: Secondary | ICD-10-CM

## 2023-05-25 DIAGNOSIS — M79602 Pain in left arm: Secondary | ICD-10-CM | POA: Diagnosis not present

## 2023-05-25 DIAGNOSIS — R739 Hyperglycemia, unspecified: Secondary | ICD-10-CM | POA: Diagnosis not present

## 2023-05-25 DIAGNOSIS — Z114 Encounter for screening for human immunodeficiency virus [HIV]: Secondary | ICD-10-CM

## 2023-05-25 DIAGNOSIS — R21 Rash and other nonspecific skin eruption: Secondary | ICD-10-CM

## 2023-05-25 LAB — LIPID PANEL
Cholesterol: 149 mg/dL (ref 0–200)
HDL: 35.2 mg/dL — ABNORMAL LOW (ref >39.00)
NonHDL: 113.43
Total CHOL/HDL Ratio: 4
Triglycerides: 344 mg/dL — ABNORMAL HIGH (ref 0.0–149.0)
VLDL: 68.8 mg/dL — ABNORMAL HIGH (ref 0.0–40.0)

## 2023-05-25 LAB — HEMOGLOBIN A1C: Hgb A1c MFr Bld: 11.2 % — ABNORMAL HIGH (ref 4.6–6.5)

## 2023-05-25 LAB — TSH: TSH: 9.23 u[IU]/mL — ABNORMAL HIGH (ref 0.35–5.50)

## 2023-05-25 LAB — LDL CHOLESTEROL, DIRECT: Direct LDL: 79 mg/dL

## 2023-05-25 MED ORDER — TRIAMCINOLONE ACETONIDE 0.1 % EX CREA: 1.0000 | TOPICAL_CREAM | Freq: Two times a day (BID) | CUTANEOUS | 0 refills | Status: DC

## 2023-05-25 NOTE — Addendum Note (Signed)
Addended by: Ardith Dark on: 05/25/2023 02:00 PM   Modules accepted: Level of Service

## 2023-05-25 NOTE — Assessment & Plan Note (Signed)
Last A1c uncontrolled at 10.8.  He has been off all medications for a couple of years.  Will be rechecking labs today.  If A1c is still uncontrolled would likely start GLP-1 agonist.  We discussed lifestyle modifications.

## 2023-05-25 NOTE — Progress Notes (Signed)
Chief Complaint:  Jared Whitehead is a 42 y.o. male who presents today for his annual comprehensive physical exam.    Assessment/Plan:  New/Acute Problems: Rectal Bleeding  No red flags.  Symptoms do seem to be improving.  Had workup in the ED a couple days ago which is reassuring.  GI referral is pending.  We discussed reasons to return to care and seek emergent care.  Shoulder Pain Exam consistent with rotator cuff tendinopathy.  May have some underlying osteoarthritis as well.  We did discuss referral back to physical therapy and sports medicine however he deferred for now.  We discussed home exercise program and handout was given.  He can use over-the-counter analgesics as needed.  He will let us know if not proving in the next 1 to 2 weeks and we can refer at that time.  Left Arm Pain Positive Tinel sign on exam.  Likely has carpal tunnel syndrome.  May have some mild triceps strain as well due to the repetitive nature of his work.  As above we did discuss referral to force medicine however he declined for now.  He will be switching jobs later this year and he thinks that should help some with his symptoms.  We discussed home exercise program and handout was given.  Discussed reasons to return to care.  Rash No red flags.  Consistent with contact dermatitis.  Will start topical triamcinolone.  Chronic Problems Addressed Today: Hyperglycemia Last A1c uncontrolled at 10.8.  He has been off all medications for a couple of years.  Will be rechecking labs today.  If A1c is still uncontrolled would likely start GLP-1 agonist.  We discussed lifestyle modifications.   Preventative Healthcare: Tdap given today.  Check labs.  Patient Counseling(The following topics were reviewed and/or handout was given):  -Nutrition: Stressed importance of moderation in sodium/caffeine intake, saturated fat and cholesterol, caloric balance, sufficient intake of fresh fruits, vegetables, and fiber.  -Stressed  the importance of regular exercise.   -Substance Abuse: Discussed cessation/primary prevention of tobacco, alcohol, or other drug use; driving or other dangerous activities under the influence; availability of treatment for abuse.   -Injury prevention: Discussed safety belts, safety helmets, smoke detector, smoking near bedding or upholstery.   -Sexuality: Discussed sexually transmitted diseases, partner selection, use of condoms, avoidance of unintended pregnancy and contraceptive alternatives.   -Dental health: Discussed importance of regular tooth brushing, flossing, and dental visits.  -Health maintenance and immunizations reviewed. Please refer to Health maintenance section.  Return to care in 1 year for next preventative visit.     Subjective:  HPI:  See Assessment / plan for status of chronic conditions.  He was seen here about 3 year ago. We did start him back on metformin but then he discontinued this due to diarhhea. He has been off for couple of year.    He is also having some pain in his left shoulder. This has been going on for quite awhile but has gotten worse the last month or so. No treatments tried. Symptoms have probably gotten worse due to the nature of his job. Sometimes takes tylenol and ibuprofen.  He is also sometimes get burning sensation in left arm.  This comes and goes.  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  He has also been having some blood in his stool. He went to the Emergency Department and had labs and imaging which was normal. He  was referred to gastroenterology. He is still having some blood in stool.   Also has a very pruritic rash on bilateral lower extremities.  Started a couple of days ago.  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  Not sure if he was exposed to poison ivy or poison oak.  Lifestyle Diet: None specific.  Exercise: Very busy with work. Goes running about twice per week.       05/25/2023    1:15 PM  Depression screen PHQ 2/9  Decreased Interest 0  Down, Depressed, Hopeless 0  PHQ - 2 Score 0    Health Maintenance Due  Topic Date Due   HIV Screening  Never done   Hepatitis C Screening  Never done     ROS: Per HPI, otherwise a complete review of systems was negative.   PMH:  The following were reviewed and entered/updated in epic: Past Medical History:  Diagnosis Date   Allergy    Asthma    Hypertension    Patient Active Problem List   Diagnosis Date Noted   Snoring 02/12/2019   Hyperglycemia 12/20/2018   Elevated blood pressure reading 12/19/2018   Erectile dysfunction 12/19/2018   Allergic rhinitis 12/19/2018   No past surgical history on file.  Family History  Adopted: Yes  Problem Relation Age of Onset   Heart attack Father     Medications- reviewed and updated Current Outpatient Medications  Medication Sig Dispense Refill   cetirizine (ZYRTEC) 10 MG chewable tablet Chew 10 mg by mouth daily.     triamcinolone cream (KENALOG) 0.1 % Apply 1 Application topically 2 (two) times daily. 30 g 0   fluticasone (FLONASE) 50 MCG/ACT nasal spray Place 2 sprays into both nostrils daily. 16 g 2   No current facility-administered medications for this visit.    Allergies-reviewed and updated No Known Allergies  Social History   Socioeconomic History   Marital status: Single    Spouse name: Not on file   Number of children: Not on file   Years of education: Not on file   Highest education level: Not on file  Occupational History   Not on file  Tobacco Use   Smoking status: Never   Smokeless tobacco: Never  Vaping Use   Vaping Use: Never used  Substance and Sexual Activity   Alcohol use: Yes   Drug use: Never   Sexual activity: Never  Other Topics Concern   Not on file  Social History Narrative   Not on file   Social Determinants of Health   Financial Resource Strain: Not on file  Food Insecurity: Not on file   Transportation Needs: Not on file  Physical Activity: Not on file  Stress: Not on file  Social Connections: Not on file        Objective:  Physical Exam: BP 130/82 (BP Location: Left Arm, Patient Position: Sitting, Cuff Size: Large)   Pulse 91   Temp 98.4 F (36.9 C) (Temporal)   Ht 6\' 1"  (1.854 m)   Wt 276 lb (125.2 kg)   SpO2 96%   BMI 36.41 kg/m   Body mass index is 36.41 kg/m. Wt Readings from Last 3 Encounters:  05/25/23 276 lb (125.2 kg)  05/23/23 275 lb (124.7 kg)  11/29/20 278 lb (126.1 kg)   Gen: NAD, resting comfortably HEENT: TMs normal bilaterally. OP clear. No thyromegaly noted.  CV: RRR with no murmurs appreciated Pulm: NWOB, CTAB with no crackles, wheezes, or rhonchi GI: Normal bowel sounds present.  Soft, Nontender, Nondistended. MSK:  - right ARm: No deformities.  Tenderness palpation along distal clavicle.  Pain elicited with resisted supraspinatus.  Normal internal and external rotation.  Neurovascular intact distally.  Palpable click with passive range of motion at right [facility] joint. -Left arm: No deformities.  Nontender to palpation.  Normal strength throughout.  No pain elicited with resisted supraspinatus testing.  Positive Tinel sign at left wrist.  Negative at medial epicondyle. Skin: warm, dry.  Erythematous rash with several ulcerations on bilateral medial lower extremities. Neuro: CN2-12 grossly intact. Strength 5/5 in upper and lower extremities. Reflexes symmetric and intact bilaterally.  Psych: Normal affect and thought content     Jared Whitehead M. Jimmey Ralph, MD 05/25/2023 1:58 PM

## 2023-05-25 NOTE — Patient Instructions (Addendum)
It was very nice to see you today!  Please work on the exercises for your arm and shoulder.  Let us know if not improving in the next 1 to 2 weeks.  Use the triamcinolone for your rash.  Return in about 1 year (around 05/24/2024) for Annual Physical.   Take care, Dr Jimmey Ralph  PLEASE NOTE:  If you had any lab tests, please let us know if you have not heard back within a few days. You may see your results on mychart before we have a chance to review them but we will give you a call once they are reviewed by [place].   If we ordered any referrals today, please let us know if you have not heard from their office within the next week.   If you had any urgent prescriptions sent in today, please check with the pharmacy within an hour of our visit to make sure the prescription was transmitted appropriately.   Please try these tips to maintain a healthy lifestyle:  Eat at least 3 REAL meals and 1-2 snacks per day.  Aim for no more than 5 hours between eating.  If you eat breakfast, please do so within one hour of getting up.   Each meal should contain half fruits/vegetables, one quarter protein, and one quarter carbs (no bigger than a computer mouse)  Cut down on sweet beverages. This includes juice, soda, and sweet tea.   Drink at least 1 glass of water with each meal and aim for at least 8 glasses per day  Exercise at least 150 minutes every week.    Preventive Care 75-24 Years Old, Male Preventive care refers to lifestyle choices and visits with your health care provider that can promote health and wellness. Preventive care visits are also called wellness exams. What can I expect for my preventive care visit? Counseling During your preventive care visit, your health care provider may ask about your: Medical history, including: Past medical problems. Family medical history. Current health, including: Emotional well-being. Home life and relationship well-being. Sexual activity. Lifestyle,  including: Alcohol, nicotine or tobacco, and drug use. Access to firearms. Diet, exercise, and sleep habits. Safety issues such as seatbelt and bike helmet use. Sunscreen use. Work and work Astronomer. Physical exam Your health care provider will check your: Height and weight. These may be used to calculate your BMI (body mass index). BMI is a measurement that tells if you are at a healthy weight. Waist circumference. This measures the distance around your waistline. This measurement also tells if you are at a healthy weight and may help predict your risk of certain diseases, such as type 2 diabetes and high blood pressure. Heart rate and blood pressure. Body temperature. Skin for abnormal spots. What immunizations do I need?  Vaccines are usually given at various ages, according to a schedule. Your health care provider will recommend vaccines for you based on your age, medical history, and lifestyle or other factors, such as travel or where you work. What tests do I need? Screening Your health care provider may recommend screening tests for certain conditions. This may include: Lipid and cholesterol levels. Diabetes screening. This is done by checking your blood sugar (glucose) after you have not eaten for a while (fasting). Hepatitis B test. Hepatitis C test. HIV (human immunodeficiency virus) test. STI (sexually transmitted infection) testing, if you are at risk. Lung cancer screening. Prostate cancer screening. Colorectal cancer screening. Talk with your health care provider about your test results, treatment  options, and if necessary, the need for more tests. Follow these instructions at home: Eating and drinking  Eat a diet that includes fresh fruits and vegetables, whole grains, lean protein, and low-fat dairy products. Take vitamin and mineral supplements as recommended by your health care provider. Do not drink alcohol if your health care provider tells you not to  drink. If you drink alcohol: Limit how much you have to 0-2 drinks a day. Know how much alcohol is in your drink. In the [place] one drink equals one 12 oz bottle of beer (355 mL), one 5 oz glass of wine (148 mL), or one 1 oz glass of hard liquor (44 mL). Lifestyle Brush your teeth every morning and night with fluoride toothpaste. Floss one time each day. Exercise for at least 30 minutes 5 or more days each week. Do not use any products that contain nicotine or tobacco. These products include cigarettes, chewing tobacco, and vaping devices, such as e-cigarettes. If you need help quitting, ask your health care provider. Do not use drugs. If you are sexually active, practice safe sex. Use a condom or other form of protection to prevent STIs. Take aspirin only as told by your health care provider. Make sure that you understand how much to take and what form to take. Work with your health care provider to find out whether it is safe and beneficial for you to take aspirin daily. Find healthy ways to manage stress, such as: Meditation, yoga, or listening to music. Journaling. Talking to a trusted person. Spending time with friends and family. Minimize exposure to UV radiation to reduce your risk of skin cancer. Safety Always wear your seat belt while driving or riding in a vehicle. Do not drive: If you have been drinking alcohol. Do not ride with someone who has been drinking. When you are tired or distracted. While texting. If you have been using any mind-altering substances or drugs. Wear a helmet and other protective equipment during sports activities. If you have firearms in your house, make sure you follow all gun safety procedures. What's next? Go to your health care provider once a year for an annual wellness visit. Ask your health care provider how often you should have your eyes and teeth checked. Stay up to date on all vaccines. This information is not intended to replace advice  given to you by your health care provider. Make sure you discuss any questions you have with your health care provider. Document Revised: 05/11/2021 Document Reviewed: 05/11/2021 Elsevier Patient Education  2024 [employer].

## 2023-05-26 LAB — HIV ANTIBODY (ROUTINE TESTING W REFLEX): HIV 1&2 Ab, 4th Generation: NONREACTIVE

## 2023-05-26 LAB — HEPATITIS C ANTIBODY: Hepatitis C Ab: NONREACTIVE

## 2023-05-29 NOTE — Progress Notes (Signed)
His thyroid levels are off.  Please have him come back to recheck TSH, T4, and T3.  His A1c is elevated into the diabetic range.  Would like for him to come back to schedule appointment to discuss treatment options.  His HDL is a bit low and triglycerides are elevated however his LDL is at goal.  We can continue to monitor this for now however may need to start a cholesterol medication at some point.  The rest of his labs are all stable and we can recheck everything in a year or so.

## 2023-06-18 ENCOUNTER — Encounter: Payer: Self-pay | Admitting: Family Medicine

## 2023-06-18 ENCOUNTER — Ambulatory Visit (INDEPENDENT_AMBULATORY_CARE_PROVIDER_SITE_OTHER): Payer: No Typology Code available for payment source | Admitting: Family Medicine

## 2023-06-18 VITALS — BP 162/100 | HR 98 | Temp 98.2°F | Ht 73.0 in | Wt 275.0 lb

## 2023-06-18 DIAGNOSIS — E1169 Type 2 diabetes mellitus with other specified complication: Secondary | ICD-10-CM

## 2023-06-18 DIAGNOSIS — R030 Elevated blood-pressure reading, without diagnosis of hypertension: Secondary | ICD-10-CM | POA: Diagnosis not present

## 2023-06-18 DIAGNOSIS — Z7985 Long-term (current) use of injectable non-insulin antidiabetic drugs: Secondary | ICD-10-CM | POA: Diagnosis not present

## 2023-06-18 DIAGNOSIS — R7989 Other specified abnormal findings of blood chemistry: Secondary | ICD-10-CM

## 2023-06-18 MED ORDER — TIRZEPATIDE 2.5 MG/0.5ML ~~LOC~~ SOAJ: 2.5000 mg | SUBCUTANEOUS | 0 refills | Status: DC

## 2023-06-18 MED ORDER — TIRZEPATIDE 5 MG/0.5ML ~~LOC~~ SOAJ
5.0000 mg | SUBCUTANEOUS | 0 refills | Status: DC
Start: 2023-06-18 — End: 2023-08-03

## 2023-06-18 NOTE — Assessment & Plan Note (Signed)
At goal at his physical a couple weeks ago.  His hypothyroidism may be contributing.  He will monitor at home and let us know if persistently elevated.  Recheck again in a few months.

## 2023-06-18 NOTE — Assessment & Plan Note (Signed)
Likely has hypothyroidism.  This could explain his elevated sugar, weight gain, constipation.  Will be rechecking TSH, T4, and T3.  If labs confirm hyperthyroidism we will start Synthroid.  He is agreeable to this plan.

## 2023-06-18 NOTE — Patient Instructions (Signed)
It was very nice to see you today!  We will start mounarjo. This will help with your sugar and weight.  We will check your thyroid levels again. We may need to start a thyroid medication depending on the results.  Keep up the good work with diet and exercise.   Return in about 3 months (around 09/18/2023) for Follow Up.   Take care, Dr Jimmey Ralph  PLEASE NOTE:  If you had any lab tests, please let us know if you have not heard back within a few days. You may see your results on mychart before we have a chance to review them but we will give you a call once they are reviewed by [place].   If we ordered any referrals today, please let us know if you have not heard from their office within the next week.   If you had any urgent prescriptions sent in today, please check with the pharmacy within an hour of our visit to make sure the prescription was transmitted appropriately.   Please try these tips to maintain a healthy lifestyle:  Eat at least 3 REAL meals and 1-2 snacks per day.  Aim for no more than 5 hours between eating.  If you eat breakfast, please do so within one hour of getting up.   Each meal should contain half fruits/vegetables, one quarter protein, and one quarter carbs (no bigger than a computer mouse)  Cut down on sweet beverages. This includes juice, soda, and sweet tea.   Drink at least 1 glass of water with each meal and aim for at least 8 glasses per day  Exercise at least 150 minutes every week.

## 2023-06-18 NOTE — Progress Notes (Signed)
   Jared Whitehead is a 42 y.o. male who presents today for an office visit.  Assessment/Plan:  Chronic Problems Addressed Today: Abnormal TSH Likely has hypothyroidism.  This could explain his elevated sugar, weight gain, constipation.  Will be rechecking TSH, T4, and T3.  If labs confirm hyperthyroidism we will start Synthroid.  He is agreeable to this plan.  T2DM (type 2 diabetes mellitus) (HCC) A1c not controlled 11.2.  He is working on diet and exercise.  Discussed referral to diabetes educator however he declined.  We did discuss medication options.  He has not tolerated metformin in the past..  Will start Mounjaro 2.5 mg weekly for 4 weeks and then increase to 5 mg weekly.  We discussed potential side effects. Follow up in 3 months for repeat A1c.   Elevated blood pressure reading At goal at his physical a couple weeks ago.  His hypothyroidism may be contributing.  He will monitor at home and let us know if persistently elevated.  Recheck again in a few months.     Subjective:  HPI:  See A/P for status of chronic conditions.  Patient is here today to follow-up on recent labs.  He was here few weeks ago for annual physical.  At that time A1c was elevated into 11.2 range.  Was also found to have elevated TSH of 9.23.  He has tried cutting down on sugar and also cutting down on carbs.  He does not have any family history of thyroid issues.  He does admit to being more tired.  Also some issues with constipation and dry mouth.       Objective:  Physical Exam: BP (!) 162/100   Pulse 98   Temp 98.2 F (36.8 C)   Ht 6\' 1"  (1.854 m)   Wt 275 lb (124.7 kg)   SpO2 96%   BMI 36.28 kg/m   Gen: No acute distress, resting comfortably CV: Regular rate and rhythm with no murmurs appreciated Pulm: Normal work of breathing, clear to auscultation bilaterally with no crackles, wheezes, or rhonchi Neuro: Grossly normal, moves all extremities Psych: Normal affect and thought content      Jared Whitehead  M. Jimmey Ralph, MD 06/18/2023 2:16 PM

## 2023-06-18 NOTE — Assessment & Plan Note (Signed)
A1c not controlled 11.2.  He is working on diet and exercise.  Discussed referral to diabetes educator however he declined.  We did discuss medication options.  He has not tolerated metformin in the past..  Will start Mounjaro 2.5 mg weekly for 4 weeks and then increase to 5 mg weekly.  We discussed potential side effects. Follow up in 3 months for repeat A1c.

## 2023-06-19 ENCOUNTER — Other Ambulatory Visit: Payer: Self-pay | Admitting: *Deleted

## 2023-06-19 ENCOUNTER — Telehealth: Payer: Self-pay | Admitting: Family Medicine

## 2023-06-19 DIAGNOSIS — R7989 Other specified abnormal findings of blood chemistry: Secondary | ICD-10-CM

## 2023-06-19 LAB — T4, FREE: Free T4: 0.64 ng/dL (ref 0.60–1.60)

## 2023-06-19 LAB — T3, FREE: T3, Free: 3.6 pg/mL (ref 2.3–4.2)

## 2023-06-19 LAB — TSH: TSH: 6.7 u[IU]/mL — ABNORMAL HIGH (ref 0.35–5.50)

## 2023-06-19 MED ORDER — LEVOTHYROXINE SODIUM 25 MCG PO TABS: 25.0000 ug | ORAL_TABLET | Freq: Every day | ORAL | 3 refills | Status: DC

## 2023-06-19 NOTE — Progress Notes (Signed)
His thyroid level is improving.  Since he is having symptoms it would be reasonable for [place] to start a thyroid medication at this point.  Please send in Synthroid 25 mcg daily.  We discussed this in the office visit.  He should come back in 6 weeks for lab visit.  Please place future order for TSH, free T4, and free T3.

## 2023-06-19 NOTE — Telephone Encounter (Signed)
Patient returned call. Requests to be called. 

## 2023-06-19 NOTE — Telephone Encounter (Signed)
See lab resutls

## 2023-06-21 ENCOUNTER — Encounter: Payer: Self-pay | Admitting: Family Medicine

## 2023-06-22 NOTE — Telephone Encounter (Signed)
FYI

## 2023-06-25 NOTE — Telephone Encounter (Signed)
Is there a PA that needs to be completed or does his insurance have any recommended alternatives?  Katina Degree. Jimmey Ralph, MD 06/25/2023 7:26 AM

## 2023-08-03 ENCOUNTER — Encounter: Payer: Self-pay | Admitting: Gastroenterology

## 2023-08-03 ENCOUNTER — Ambulatory Visit (INDEPENDENT_AMBULATORY_CARE_PROVIDER_SITE_OTHER): Payer: No Typology Code available for payment source | Admitting: Gastroenterology

## 2023-08-03 VITALS — BP 160/100 | HR 100 | Ht 73.0 in | Wt 277.0 lb

## 2023-08-03 DIAGNOSIS — K921 Melena: Secondary | ICD-10-CM | POA: Diagnosis not present

## 2023-08-03 MED ORDER — NA SULFATE-K SULFATE-MG SULF 17.5-3.13-1.6 GM/177ML PO SOLN: 1.0000 | Freq: Once | ORAL | 0 refills | Status: AC

## 2023-08-03 NOTE — Patient Instructions (Signed)
You have been scheduled for a colonoscopy. Please follow written instructions given to you at your visit today.   Please pick up your prep supplies at the pharmacy within the next 1-3 days.  If you use inhalers (even only as needed), please bring them with you on the day of your procedure.  DO NOT TAKE 7 DAYS PRIOR TO TEST- Trulicity (dulaglutide) Ozempic, Wegovy (semaglutide) Mounjaro (tirzepatide) Bydureon Bcise (exanatide extended release)  DO NOT TAKE 1 DAY PRIOR TO YOUR TEST Rybelsus (semaglutide) Adlyxin (lixisenatide) Victoza (liraglutide) Byetta (exanatide) ___________________________________________________________________________   If your blood pressure at your visit was 140/90 or greater, please contact your primary care physician to follow up on this.  _______________________________________________________  If you are age 50 or older, your body mass index should be between 23-30. Your Body mass index is 36.55 kg/m. If this is out of the aforementioned range listed, please consider follow up with your Primary Care Provider.  If you are age 91 or younger, your body mass index should be between 19-25. Your Body mass index is 36.55 kg/m. If this is out of the aformentioned range listed, please consider follow up with your Primary Care Provider.   ________________________________________________________  The [facility] providers would like to encourage you to use [facility] to communicate with providers for non-urgent requests or questions.  Due to long hold times on the telephone, sending your provider a message by [facility] may be a faster and more efficient way to get a response.  Please allow 48 business hours for a response.  Please remember that this is for non-urgent requests.   It was a pleasure to see you today!  Thank you for trusting me with your gastrointestinal care!    Scott E.Tomasa Rand, MD

## 2023-08-03 NOTE — Progress Notes (Unsigned)
HPI : Jared Whitehead is a 42 y.o. male with a history of mild intermittent asthma and hypertension who is referred to [place] by Ardith Dark, MD for further evaluation of hematochezia.   He states that he has been seeing blood in his stools for at least a year or so.  The blood is seen intermittently, estimates 5-6 times in the past 2 months.  It is bright red, and noticed in the stool, toilet water and on the toilet paper.  No other associated symptoms.  Specifically, he denies any problems with abdominal pain, constipation or diarrhea.  He also denies any other perianal symptoms such as pain with passage of stool, perianal itching/burning, swelling, tissue prolapse or seepage.  He thinks he is more likely to see blood if he does pass a hard stool, or if he has multiple bowel movements in a day. He went to the ED in June with hematochezia.  Perianal exam was normal without hemorrhoids or fissure.  CBC and vitals were normal. A CT showed nonspecific presacral edema, but bowel thickening, mass lesion or other abnormalities to explain hematochezia.  He was discharged home with plans for outpatient GI follow up. He was noted to elevated blood sugar and a subsequent A1c was significantly elevated.  He has no family history of colon cancer or other GI malignancy.  His weight has been stable.  He has no known cardiopulmonary comorbidities and denies any concerning symptoms such has chest pain/pressure, shortness of breath, light-headedness/dizziness/syncope, palpitations.      Past Medical History:  Diagnosis Date   Allergy    Asthma    Hypertension      History reviewed. No pertinent surgical history. Family History  Adopted: Yes  Problem Relation Age of Onset   Heart attack Father    Liver disease Neg Hx    Colon cancer Neg Hx    Esophageal cancer Neg Hx    Social History   Tobacco Use   Smoking status: Never   Smokeless tobacco: Never  Vaping Use   Vaping status: Never Used   Substance Use Topics   Alcohol use: Yes   Drug use: Never   Current Outpatient Medications  Medication Sig Dispense Refill   levothyroxine (SYNTHROID) 25 MCG tablet Take 1 tablet (25 mcg total) by mouth daily. 30 tablet 3   No current facility-administered medications for this visit.   No Known Allergies   Review of Systems: All systems reviewed and negative except where noted in HPI.    No results found.  Physical Exam: BP (!) 160/100   Pulse 100   Ht 6\' 1"  (1.854 m)   Wt 277 lb (125.6 kg)   BMI 36.55 kg/m  Constitutional: Pleasant,well-developed, Caucasian male in no acute distress. HEENT: Normocephalic and atraumatic. Conjunctivae are normal. No scleral icterus. Neck supple.  Cardiovascular: Normal rate, regular rhythm.  Pulmonary/chest: Effort normal and breath sounds normal. No wheezing, rales or rhonchi. Abdominal: Soft, nondistended, nontender. Bowel sounds active throughout. There are no masses palpable. No hepatomegaly. Extremities: no edema Rectal: Deferred until time of colonoscopy. Neurological: Alert and oriented to person place and time. Skin: Skin is warm and dry. No rashes noted. Psychiatric: Normal mood and affect. Behavior is normal.  CBC    Component Value Date/Time   WBC 5.4 05/23/2023 0826   RBC 5.08 05/23/2023 0826   HGB 15.1 05/23/2023 0826   HCT 43.2 05/23/2023 0826   PLT 208 05/23/2023 0826   MCV 85.0 05/23/2023 0826  MCH 29.7 05/23/2023 0826   MCHC 35.0 05/23/2023 0826   RDW 12.1 05/23/2023 0826   LYMPHSABS 1.6 05/23/2023 0826   MONOABS 0.4 05/23/2023 0826   EOSABS 0.2 05/23/2023 0826   BASOSABS 0.1 05/23/2023 0826    CMP     Component Value Date/Time   NA 135 05/23/2023 0826   K 4.3 05/23/2023 0826   CL 96 (L) 05/23/2023 0826   CO2 25 05/23/2023 0826   GLUCOSE 307 (H) 05/23/2023 0826   BUN 11 05/23/2023 0826   CREATININE 0.96 05/23/2023 0826   CREATININE 1.01 10/28/2020 1350   CALCIUM 9.4 05/23/2023 0826   PROT 7.6  05/23/2023 0826   ALBUMIN 4.0 05/23/2023 0826   AST 25 05/23/2023 0826   ALT 31 05/23/2023 0826   ALKPHOS 58 05/23/2023 0826   BILITOT 0.9 05/23/2023 0826   GFRNONAA >60 05/23/2023 0826       Latest Ref Rng & Units 05/23/2023    8:26 AM 10/28/2020    1:50 PM 08/12/2019    9:42 AM  CBC EXTENDED  WBC 4.0 - 10.5 K/uL 5.4  6.3  6.6   RBC 4.22 - 5.81 MIL/uL 5.08  5.06  5.33   Hemoglobin 13.0 - 17.0 g/dL 40.9  81.1  91.4   HCT 39.0 - 52.0 % 43.2  43.6  45.9   Platelets 150 - 400 K/uL 208  231  225.0   NEUT# 1.7 - 7.7 K/uL 3.1     Lymph# 0.7 - 4.0 K/uL 1.6         ASSESSMENT AND PLAN: 42 year old male with recurrent painless hematochezia, without other significant symptoms such as abdominal pain, constipation or diarrhea.  No other perianal symptoms such as pain with passage of stool, perianal swelling, itching or burning.  Hematochezia is most likely secondary to internal hemorrhoids, but given his age, a colonoscopy is indicated to definitively exclude other etiologies, namely mass lesion/large polyp. Will schedule patient for diagnostic colonoscopy.  Hematochezia - Colonoscopy  The details, risks (including bleeding, perforation, infection, missed lesions, medication reactions and possible hospitalization or surgery if complications occur), benefits, and alternatives to colonoscopy with possible biopsy and possible polypectomy were discussed with the patient and he consents to proceed.   Camaya Gannett E. Tomasa Rand, MD [facility]   Ardith Dark, MD

## 2023-08-20 ENCOUNTER — Encounter: Payer: Self-pay | Admitting: Gastroenterology

## 2023-08-31 ENCOUNTER — Ambulatory Visit: Payer: No Typology Code available for payment source | Admitting: Gastroenterology

## 2023-08-31 ENCOUNTER — Encounter: Payer: Self-pay | Admitting: Gastroenterology

## 2023-08-31 VITALS — BP 133/79 | HR 95 | Temp 98.1°F | Resp 12 | Ht 73.0 in | Wt 270.0 lb

## 2023-08-31 DIAGNOSIS — K921 Melena: Secondary | ICD-10-CM

## 2023-08-31 MED ORDER — SODIUM CHLORIDE 0.9 % IV SOLN
500.0000 mL | Freq: Once | INTRAVENOUS | Status: DC
Start: 2023-08-31 — End: 2023-08-31

## 2023-08-31 NOTE — Progress Notes (Signed)
Sedate, gd SR, tolerated procedure well, VSS, report to RN 

## 2023-08-31 NOTE — Op Note (Signed)
[facility] Patient Name: Jared Whitehead Procedure Date: 08/31/2023 3:55 PM MRN: [id number] Endoscopist: Lorin Picket E. Tomasa Rand , MD, [id number] Age: 42 Referring MD:  Date of Birth: 05-17-1981 Gender: Male Account #: [id number] Procedure:                Colonoscopy Indications:              Hematochezia Medicines:                Monitored Anesthesia Care Procedure:                Pre-Anesthesia Assessment:                           - Prior to the procedure, a History and Physical                            was performed, and patient medications and                            allergies were reviewed. The patient's tolerance of                            previous anesthesia was also reviewed. The risks                            and benefits of the procedure and the sedation                            options and risks were discussed with the patient.                            All questions were answered, and informed consent                            was obtained. Prior Anticoagulants: The patient has                            taken no anticoagulant or antiplatelet agents. ASA                            Grade Assessment: II - A patient with mild systemic                            disease. After reviewing the risks and benefits,                            the patient was deemed in satisfactory condition to                            undergo the procedure.                           After obtaining informed consent, the colonoscope  was passed under direct vision. Throughout the                            procedure, the patient's blood pressure, pulse, and                            oxygen saturations were monitored continuously. The                            CF [id number] #[id number] was introduced through the anus                            and advanced to the the terminal ileum, with                            identification of the appendiceal orifice and IC                             valve. The colonoscopy was performed without                            difficulty. The patient tolerated the procedure                            well. The quality of the bowel preparation was                            adequate. The terminal ileum, ileocecal valve,                            appendiceal orifice, and rectum were photographed.                            The bowel preparation used was SUPREP via split                            dose instruction. Scope In: 4:09:46 PM Scope Out: 4:20:36 PM Scope Withdrawal Time: 0 hours 9 minutes 8 seconds  Total Procedure Duration: 0 hours 10 minutes 50 seconds  Findings:                 The perianal and digital rectal examinations were                            normal. Pertinent negatives include normal                            sphincter tone and no palpable rectal lesions.                           A single medium-mouthed diverticulum was found in                            the ascending colon. There was no evidence of  diverticular bleeding.                           The exam was otherwise normal throughout the                            examined colon.                           The terminal ileum appeared normal.                           The retroflexed view of the distal rectum and anal                            verge was normal and showed no anal or rectal                            abnormalities. Complications:            No immediate complications. Estimated Blood Loss:     Estimated blood loss: none. Impression:               - Diverticulosis in the ascending colon. There was                            no evidence of diverticular bleeding.                           - The examined portion of the ileum was normal.                           - The distal rectum and anal verge are normal on                            retroflexion view.                           - No specimens  collected.                           - Although no prominent hemorrhoid columns noted on                            today's exam, no other possible sources of                            hematochezia found in the colon. Patient's bright                            red blood can be attributed to hemorrhoidal                            bleeding. Recommendation:           - Patient has a contact number available for  emergencies. The signs and symptoms of potential                            delayed complications were discussed with the                            patient. Return to normal activities tomorrow.                            Written discharge instructions were provided to the                            patient.                           - Resume previous diet.                           - Continue present medications.                           - Repeat colonoscopy in 10 years for screening                            purposes.                           - Recommend high fiber diet/daily fiber supplement                            to reduce hemorrhoidal symptoms                           - Follow up with primary to address high blood                            pressure and diabetes. Mccormick Macon E. Tomasa Rand, MD 08/31/2023 4:27:08 PM This report has been signed electronically.

## 2023-08-31 NOTE — Patient Instructions (Signed)

## 2023-08-31 NOTE — Progress Notes (Signed)
History and Physical Interval Note:  08/31/2023 4:01 PM  Jared Whitehead  has presented today for endoscopic procedure(s), with the diagnosis of  Encounter Diagnosis  Name Primary?   Hematochezia Yes  .  The various methods of evaluation and treatment have been discussed with the patient and/or family. After consideration of risks, benefits and other options for treatment, the patient has consented to  the endoscopic procedure(s).   The patient's history has been reviewed, patient examined, no change in status, stable for endoscopic procedure(s).  I have reviewed the patient's chart and labs.  Questions were answered to the patient's satisfaction.     Brookelyn Gaynor E. Tomasa Rand, MD [facility]

## 2023-08-31 NOTE — Progress Notes (Signed)
Updated medical record.

## 2023-09-03 ENCOUNTER — Telehealth: Payer: Self-pay

## 2023-09-03 NOTE — Telephone Encounter (Deleted)
  Follow up Call-     08/31/2023    3:09 PM  Call back number  Post procedure Call Back phone  # [phone]  Permission to leave phone message Yes     Patient questions:  Do you have a fever, pain , or abdominal swelling? {yes no:314532} Pain Score  {NUMBERS; 0-10:5044} *  Have you tolerated food without any problems? {yes no:314532}  Have you been able to return to your normal activities? {yes no:314532}  Do you have any questions about your discharge instructions: Diet   {yes no:314532} Medications  {yes no:314532} Follow up visit  {yes no:314532}  Do you have questions or concerns about your Care? {yes no:314532}  Actions: * If pain score is 4 or above: {ACTION; [facility] ENDO PAIN >4:21563}

## 2023-09-03 NOTE — Telephone Encounter (Signed)
  Follow up Call-     08/31/2023    3:09 PM  Call back number  Post procedure Call Back phone  # [phone]  Permission to leave phone message Yes     Patient mailbox is full. Could not leave message.

## 2023-09-18 ENCOUNTER — Ambulatory Visit (INDEPENDENT_AMBULATORY_CARE_PROVIDER_SITE_OTHER): Payer: Self-pay | Admitting: Family Medicine

## 2023-09-18 ENCOUNTER — Encounter: Payer: Self-pay | Admitting: Family Medicine

## 2023-09-18 VITALS — BP 132/90 | HR 105 | Temp 97.5°F | Ht 73.0 in | Wt 275.8 lb

## 2023-09-18 DIAGNOSIS — E1169 Type 2 diabetes mellitus with other specified complication: Secondary | ICD-10-CM

## 2023-09-18 DIAGNOSIS — R7989 Other specified abnormal findings of blood chemistry: Secondary | ICD-10-CM

## 2023-09-18 DIAGNOSIS — Z7984 Long term (current) use of oral hypoglycemic drugs: Secondary | ICD-10-CM

## 2023-09-18 LAB — POCT GLYCOSYLATED HEMOGLOBIN (HGB A1C): Hemoglobin A1C: 12.7 % — AB (ref 4.0–5.6)

## 2023-09-18 MED ORDER — LEVOTHYROXINE SODIUM 25 MCG PO TABS: 25.0000 ug | ORAL_TABLET | Freq: Every day | ORAL | 3 refills | Status: DC

## 2023-09-18 MED ORDER — METFORMIN HCL ER 500 MG PO TB24: 500.0000 mg | ORAL_TABLET | Freq: Two times a day (BID) | ORAL | 5 refills | Status: DC

## 2023-09-18 NOTE — Assessment & Plan Note (Addendum)
A1c not controlled 12.7.  Currently asymptomatic.  Insurance would not pay for [employer].  He is currently in between jobs and has having difficulty with insurance coverage.  He was previously on metformin and did well with this.  We did discuss potentially starting insulin however he would like to hold off on this for now.  Will restart metformin 500 mg twice daily.  He had mild diarrhea with this previously but is willing to accept the side effects for now.    He will check with his current insurance provider to see if they have any alternative coverage for him and he will send a message via MyChart if they are paying for any GLP-1 agonists.  He will continue to work on lifestyle modifications.  We discussed importance of good glycemic control to prevent complications in the future.  He will come back to see me in 3 months for follow-up.  Hopefully will he will have insurance coverage at that time and we can discuss starting GLP-1 agonist again.  If A1c is still not at goal with metformin would likely start insulin at that time.  We discussed reasons to return to care.

## 2023-09-18 NOTE — Assessment & Plan Note (Signed)
He has been off of the Synthroid for the last several weeks.  Will restart 25 mcg daily.  We can recheck TSH, T4, and T3 when he comes back in for follow-up visit in 3 months.

## 2023-09-18 NOTE — Patient Instructions (Signed)
It was very nice to see you today!  Please restart the metformin and synthroid.  I will refill your medications.   Return in about 3 months (around 12/19/2023) for Follow Up.   Take care, Dr Jimmey Ralph  PLEASE NOTE:  If you had any lab tests, please let us know if you have not heard back within a few days. You may see your results on mychart before we have a chance to review them but we will give you a call once they are reviewed by [place].   If we ordered any referrals today, please let us know if you have not heard from their office within the next week.   If you had any urgent prescriptions sent in today, please check with the pharmacy within an hour of our visit to make sure the prescription was transmitted appropriately.   Please try these tips to maintain a healthy lifestyle:  Eat at least 3 REAL meals and 1-2 snacks per day.  Aim for no more than 5 hours between eating.  If you eat breakfast, please do so within one hour of getting up.   Each meal should contain half fruits/vegetables, one quarter protein, and one quarter carbs (no bigger than a computer mouse)  Cut down on sweet beverages. This includes juice, soda, and sweet tea.   Drink at least 1 glass of water with each meal and aim for at least 8 glasses per day  Exercise at least 150 minutes every week.

## 2023-09-18 NOTE — Progress Notes (Unsigned)
   Jared Whitehead is a 42 y.o. male who presents today for an office visit.  Assessment/Plan:  Chronic Problems Addressed Today: T2DM (type 2 diabetes mellitus) (HCC) A1c not controlled 12.7.  Currently asymptomatic.  Insurance would not pay for [employer].  He is currently in between jobs and has having difficulty with insurance coverage.  He was previously on metformin and did well with this.  We did discuss potentially starting insulin however he would like to hold off on this for now.  Will restart metformin 500 mg twice daily.  He had mild diarrhea with this previously but is willing to accept the side effects for now.    He will check with his current insurance provider to see if they have any alternative coverage for him and he will send a message via MyChart if they are paying for any GLP-1 agonists.  He will continue to work on lifestyle modifications.  We discussed importance of good glycemic control to prevent complications in the future.  He will come back to see me in 3 months for follow-up.  Hopefully will he will have insurance coverage at that time and we can discuss starting GLP-1 agonist again.  If A1c is still not at goal with metformin would likely start insulin at that time.  We discussed reasons to return to care.  Abnormal TSH He has been off of the Synthroid for the last several weeks.  Will restart 25 mcg daily.  We can recheck TSH, T4, and T3 when he comes back in for follow-up visit in 3 months.     Subjective:  HPI:  See A/P for status of chronic conditions.  Patient is here today for follow-up.  We saw him 3 months ago.  At that time his A1c was 11.2.  We started him on Mounjaro.  Insurance would not pay for this. HE has been off all medications for several months.  He has also been off of Synthroid for the last several weeks.  Did not notice any difference while on this previously.  He previously was on metformin however had to discontinue due to GI side effects.  He is  willing to restart at this point.       Objective:  Physical Exam: BP (!) 132/90   Pulse (!) 105   Temp (!) 97.5 F (36.4 C) (Temporal)   Ht 6\' 1"  (1.854 m)   Wt 275 lb 12.8 oz (125.1 kg)   SpO2 96%   BMI 36.39 kg/m   Gen: No acute distress, resting comfortably CV: Regular rate and rhythm with no murmurs appreciated Pulm: Normal work of breathing, clear to auscultation bilaterally with no crackles, wheezes, or rhonchi Neuro: Grossly normal, moves all extremities Psych: Normal affect and thought content      Liridona Mashaw M. Jimmey Ralph, MD 09/18/2023 2:26 PM

## 2023-12-20 ENCOUNTER — Ambulatory Visit: Payer: No Typology Code available for payment source | Admitting: Family Medicine

## 2023-12-20 ENCOUNTER — Encounter: Payer: Self-pay | Admitting: Family Medicine

## 2023-12-20 VITALS — BP 140/101 | HR 87 | Temp 97.8°F | Ht 73.0 in | Wt 272.4 lb

## 2023-12-20 DIAGNOSIS — Z7985 Long-term (current) use of injectable non-insulin antidiabetic drugs: Secondary | ICD-10-CM

## 2023-12-20 DIAGNOSIS — J309 Allergic rhinitis, unspecified: Secondary | ICD-10-CM | POA: Diagnosis not present

## 2023-12-20 DIAGNOSIS — E1169 Type 2 diabetes mellitus with other specified complication: Secondary | ICD-10-CM | POA: Diagnosis not present

## 2023-12-20 DIAGNOSIS — R7989 Other specified abnormal findings of blood chemistry: Secondary | ICD-10-CM | POA: Diagnosis not present

## 2023-12-20 DIAGNOSIS — R030 Elevated blood-pressure reading, without diagnosis of hypertension: Secondary | ICD-10-CM

## 2023-12-20 LAB — POCT GLYCOSYLATED HEMOGLOBIN (HGB A1C): Hemoglobin A1C: 11.9 % — AB (ref 4.0–5.6)

## 2023-12-20 MED ORDER — TIRZEPATIDE 2.5 MG/0.5ML ~~LOC~~ SOAJ: 2.5000 mg | SUBCUTANEOUS | 0 refills | Status: DC

## 2023-12-20 MED ORDER — LEVOTHYROXINE SODIUM 25 MCG PO TABS: 25.0000 ug | ORAL_TABLET | Freq: Every day | ORAL | 3 refills | Status: AC

## 2023-12-20 NOTE — Assessment & Plan Note (Signed)
Initially elevated to 160/99 today.  Improved to 146/101 on recheck.  He has been well-controlled at most recent office visit.  He will monitor at home for the next few weeks and let us know if persistently elevated.  If still elevated we can start low-dose amlodipine or losartan.  Recheck again in a few months in office.

## 2023-12-20 NOTE — Assessment & Plan Note (Signed)
A1c not controlled at 11.9 though it has improved since her last visit.  He was not able to start medications due to lack of insurance and cost concerns.  He was having some issues with metformin previously as well.  He is now on new insurance.  We will try sending in prescription for [facility].  He is aware of potential side effects.  If insurance will not pay for this we can go back to metformin.  He will continue to work on diet and exercise.  He will follow-up with me in a few weeks to MyChart we can adjust dose of Mounjaro as tolerated.  He will come back in 3 months to recheck A1c.

## 2023-12-20 NOTE — Patient Instructions (Signed)
It was very nice to see you today!  Your sugar is a little bit better but still too high.  Please continue to work on diet and exercise.  We will start Mounjaro.  Please restart your thyroid medication as well.  Please monitor your blood pressure and let us know if persistently elevated.  Return in about 3 months (around 03/19/2024) for Follow Up.   Take care, Dr Jimmey Ralph  PLEASE NOTE:  If you had any lab tests, please let us know if you have not heard back within a few days. You may see your results on mychart before we have a chance to review them but we will give you a call once they are reviewed by [place].   If we ordered any referrals today, please let us know if you have not heard from their office within the next week.   If you had any urgent prescriptions sent in today, please check with the pharmacy within an hour of our visit to make sure the prescription was transmitted appropriately.   Please try these tips to maintain a healthy lifestyle:  Eat at least 3 REAL meals and 1-2 snacks per day.  Aim for no more than 5 hours between eating.  If you eat breakfast, please do so within one hour of getting up.   Each meal should contain half fruits/vegetables, one quarter protein, and one quarter carbs (no bigger than a computer mouse)  Cut down on sweet beverages. This includes juice, soda, and sweet tea.   Drink at least 1 glass of water with each meal and aim for at least 8 glasses per day  Exercise at least 150 minutes every week.

## 2023-12-20 NOTE — Assessment & Plan Note (Signed)
Still having occasional intermittent flares.  Recommended over-the-counter Flonase or Astepro.

## 2023-12-20 NOTE — Addendum Note (Signed)
Addended by: Dyann Kief on: 12/20/2023 02:42 PM   Modules accepted: Orders

## 2023-12-20 NOTE — Assessment & Plan Note (Signed)
He has not taken Synthroid for the last couple of months.  Will restart today 25 mcg daily.  Recheck TSH when he comes back in a few months.

## 2023-12-20 NOTE — Progress Notes (Signed)
   Jared Whitehead is a 43 y.o. male who presents today for an office visit.  Assessment/Plan:  Chronic Problems Addressed Today: T2DM (type 2 diabetes mellitus) (HCC) A1c not controlled at 11.9 though it has improved since her last visit.  He was not able to start medications due to lack of insurance and cost concerns.  He was having some issues with metformin previously as well.  He is now on new insurance.  We will try sending in prescription for [facility].  He is aware of potential side effects.  If insurance will not pay for this we can go back to metformin.  He will continue to work on diet and exercise.  He will follow-up with me in a few weeks to MyChart we can adjust dose of Mounjaro as tolerated.  He will come back in 3 months to recheck A1c.  Allergic rhinitis Still having occasional intermittent flares.  Recommended over-the-counter Flonase or Astepro.  Abnormal TSH He has not taken Synthroid for the last couple of months.  Will restart today 25 mcg daily.  Recheck TSH when he comes back in a few months.  Elevated blood pressure reading Initially elevated to 160/99 today.  Improved to 146/101 on recheck.  He has been well-controlled at most recent office visit.  He will monitor at home for the next few weeks and let us know if persistently elevated.  If still elevated we can start low-dose amlodipine or losartan.  Recheck again in a few months in office.     Subjective:  HPI:  See A/P for status of chronic conditions.  Patient is here today for diabetes follow-up.  I saw him 3 months ago.  At that time A1c was 12.7.  We restarted metformin 500 mg twice daily.  He did not start this due to lack of insurance.  Has not been on any medications for the last 3 months. He has been trying to cut down on sugar and stay more active.  He did not start his thyroid medication either.       Objective:  Physical Exam: BP (!) 140/101   Pulse 87   Temp 97.8 F (36.6 C) (Temporal)   Ht 6\' 1"   (1.854 m)   Wt 272 lb 6.4 oz (123.6 kg)   SpO2 97%   BMI 35.94 kg/m   Gen: No acute distress, resting comfortably CV: Regular rate and rhythm with no murmurs appreciated Pulm: Normal work of breathing, clear to auscultation bilaterally with no crackles, wheezes, or rhonchi Neuro: Grossly normal, moves all extremities Psych: Normal affect and thought content      Jared Benningfield M. Jimmey Ralph, MD 12/20/2023 2:11 PM

## 2023-12-24 NOTE — Telephone Encounter (Signed)
Please stated PA Rx Mounjaro  Thanks

## 2023-12-26 ENCOUNTER — Other Ambulatory Visit (HOSPITAL_COMMUNITY): Payer: Self-pay

## 2023-12-28 ENCOUNTER — Other Ambulatory Visit (HOSPITAL_COMMUNITY): Payer: Self-pay

## 2024-01-01 ENCOUNTER — Other Ambulatory Visit (HOSPITAL_COMMUNITY): Payer: Self-pay

## 2024-01-01 ENCOUNTER — Ambulatory Visit: Payer: No Typology Code available for payment source | Admitting: Family Medicine

## 2024-01-01 ENCOUNTER — Encounter: Payer: Self-pay | Admitting: Family Medicine

## 2024-01-01 VITALS — BP 145/99 | HR 104 | Temp 98.0°F | Ht 73.0 in | Wt 271.0 lb

## 2024-01-01 DIAGNOSIS — Z7984 Long term (current) use of oral hypoglycemic drugs: Secondary | ICD-10-CM

## 2024-01-01 DIAGNOSIS — E11638 Type 2 diabetes mellitus with other oral complications: Secondary | ICD-10-CM

## 2024-01-01 MED ORDER — METFORMIN HCL ER 500 MG PO TB24: 500.0000 mg | ORAL_TABLET | Freq: Every day | ORAL | 3 refills | Status: AC

## 2024-01-01 MED ORDER — CYCLOBENZAPRINE HCL 10 MG PO TABS: 10.0000 mg | ORAL_TABLET | Freq: Three times a day (TID) | ORAL | 0 refills | Status: AC | PRN

## 2024-01-01 MED ORDER — DICLOFENAC SODIUM 75 MG PO TBEC: 75.0000 mg | DELAYED_RELEASE_TABLET | Freq: Two times a day (BID) | ORAL | 0 refills | Status: AC

## 2024-01-01 NOTE — Progress Notes (Signed)
   Jared Whitehead is a 43 y.o. male who presents today for an office visit.  Assessment/Plan:  New/Acute Problems: Left Upper Back Pain  Exam consistent with muscular strain.  We discussed home exercise program.  Handout was given.  Also recommended heating pad.  Will start diclofenac  and Flexeril .  He will let us  know if not improving in the next 1 to 2 weeks and we can refer to PT or sports med.  Chronic Problems Addressed Today: T2DM (type 2 diabetes mellitus) (HCC) Last A1c not controlled at 11.9.  Insurance did not pay for Mounjaro .  He has not been on anything for a sugar of the last several weeks.  We will restart metformin .  Did have some diarrhea with this.  Hopefully will have less of this with extended release.  He will come back in a couple of months and we can recheck A1c at that time.  He will continue to work on lifestyle modifications.     Subjective:  HPI:  See Assessment / plan for status of chronic conditions. Patient here with left upper back. He did have something similar a year and a half ago that resolved after a few weeks. Current flare started a few days ago. Woke up with the pain. No obvious triggering events. Worse with movement and motion. He has not noticed anything that makes it better or worse. No treatments tried. Getting a little better the last few days.        Objective:  Physical Exam: BP (!) 151/96   Pulse (!) 104   Temp 98 F (36.7 C) (Temporal)   Ht 6' 1 (1.854 m)   Wt 271 lb (122.9 kg)   SpO2 98%   BMI 35.75 kg/m   Gen: No acute distress, resting comfortably MUSCULOSKELETAL - Back: No deformities.  Tender palpation along left lateral thoracic chest wall.  Neuro vastly intact distally.  Pain worsened with arm extension and abduction. Neuro: Grossly normal, moves all extremities Psych: Normal affect and thought content      Jared Whitehead M. Kennyth, MD 01/01/2024 7:48 AM

## 2024-01-01 NOTE — Patient Instructions (Addendum)
 It was very nice to see you today!  I think you have a strain in the muscles in your back.  You can use a heating pad to the area.   Please start the diclofenac  and Flexeril .  Work on the exercises.  Let us  know if not improving  I will also send in metformin .  Please come back in a couple months to have your A1c rechecked.  Return if symptoms worsen or fail to improve.   Take care, Dr Kennyth  PLEASE NOTE:  If you had any lab tests, please let us  know if you have not heard back within a few days. You may see your results on mychart before we have a chance to review them but we will give you a call once they are reviewed by us .   If we ordered any referrals today, please let us  know if you have not heard from their office within the next week.   If you had any urgent prescriptions sent in today, please check with the pharmacy within an hour of our visit to make sure the prescription was transmitted appropriately.   Please try these tips to maintain a healthy lifestyle:  Eat at least 3 REAL meals and 1-2 snacks per day.  Aim for no more than 5 hours between eating.  If you eat breakfast, please do so within one hour of getting up.   Each meal should contain half fruits/vegetables, one quarter protein, and one quarter carbs (no bigger than a computer mouse)  Cut down on sweet beverages. This includes juice, soda, and sweet tea.   Drink at least 1 glass of water with each meal and aim for at least 8 glasses per day  Exercise at least 150 minutes every week.

## 2024-01-01 NOTE — Assessment & Plan Note (Signed)
 Last A1c not controlled at 11.9.  Insurance did not pay for Mounjaro .  He has not been on anything for a sugar of the last several weeks.  We will restart metformin .  Did have some diarrhea with this.  Hopefully will have less of this with extended release.  He will come back in a couple of months and we can recheck A1c at that time.  He will continue to work on lifestyle modifications.

## 2024-01-01 NOTE — Telephone Encounter (Signed)
Called patient advise to send [place] via mychart the copy of medication insurance Stated will send the today after work

## 2024-01-01 NOTE — Telephone Encounter (Signed)
Insurance card on file does not have pharmacy benefits attached. Please have patient upload the current pharmacy benefits card.

## 2024-03-20 ENCOUNTER — Ambulatory Visit: Payer: Self-pay | Admitting: Family Medicine

## 2024-04-24 ENCOUNTER — Ambulatory Visit: Payer: Self-pay | Admitting: Family Medicine

## 2024-05-19 ENCOUNTER — Ambulatory Visit: Payer: Self-pay | Admitting: Family Medicine

## 2024-05-26 ENCOUNTER — Encounter: Payer: Self-pay | Admitting: Family Medicine
